# Patient Record
Sex: Male | Born: 1980 | Hispanic: No | Marital: Married | Smoking: Current every day smoker
Health system: Southern US, Community
[De-identification: ages and names within clinical notes are randomized; demographics above are authoritative.]

## PROBLEM LIST (undated history)

## (undated) DIAGNOSIS — F329 Major depressive disorder, single episode, unspecified: Secondary | ICD-10-CM

## (undated) DIAGNOSIS — F32A Depression, unspecified: Secondary | ICD-10-CM

## (undated) DIAGNOSIS — M549 Dorsalgia, unspecified: Secondary | ICD-10-CM

## (undated) DIAGNOSIS — G8929 Other chronic pain: Secondary | ICD-10-CM

## (undated) HISTORY — DX: Major depressive disorder, single episode, unspecified: F32.9

## (undated) HISTORY — DX: Depression, unspecified: F32.A

## (undated) HISTORY — PX: OPEN REDUCTION INTERNAL FIXATION (ORIF) HAND: SHX5991

---

## 1999-11-22 ENCOUNTER — Encounter: Payer: Self-pay | Admitting: Emergency Medicine

## 1999-11-22 ENCOUNTER — Observation Stay (HOSPITAL_COMMUNITY): Admission: EM | Admit: 1999-11-22 | Discharge: 1999-11-23 | Payer: Self-pay | Admitting: Emergency Medicine

## 2005-03-13 ENCOUNTER — Encounter: Admission: RE | Admit: 2005-03-13 | Discharge: 2005-05-08 | Payer: Self-pay | Admitting: Orthopedic Surgery

## 2007-06-15 ENCOUNTER — Emergency Department (HOSPITAL_COMMUNITY): Admission: EM | Admit: 2007-06-15 | Discharge: 2007-06-15 | Payer: Self-pay | Admitting: Emergency Medicine

## 2008-06-04 ENCOUNTER — Emergency Department (HOSPITAL_COMMUNITY): Admission: EM | Admit: 2008-06-04 | Discharge: 2008-06-04 | Payer: Self-pay | Admitting: Emergency Medicine

## 2008-09-04 ENCOUNTER — Emergency Department (HOSPITAL_COMMUNITY): Admission: EM | Admit: 2008-09-04 | Discharge: 2008-09-04 | Payer: Self-pay | Admitting: Emergency Medicine

## 2010-07-20 NOTE — Op Note (Signed)
[place]  Patient:    Omar Clark, Omar Clark                    MRN: [id number] Proc. Date: 11/22/99 Adm. Date:  [id number] Disc. Date: [id number] Attending:  Dominica Severin                           Operative Report  PREOPERATIVE DIAGNOSIS:  Right open fifth metacarpal fracture.  POSTOPERATIVE DIAGNOSIS:  Right open fifth metacarpal fracture.  PROCEDURES: 1. Incision and debridement of right open fifth metacarpal fracture. 2. Open reduction and internal fixation of right fifth metacarpal fracture.  SURGEON:  Elisha Ponder, M.D.  ASSISTANTS:  None.  COMPLICATIONS:  None.  ANESTHESIA:  General endotracheal.  DRAINS:  None.  ESTIMATED BLOOD LOSS:  Minimal.  INDICATIONS FOR THE PROCEDURE:  This patient is a 30 year old white male with the above-mentioned diagnosis.  The patient injured his hand at approximately 3 a.m.  I was called to see him at 6 a.m. on November 22, 1999, for the above-mentioned injuries.  He was consented for surgery.  All risks and benefits were discussed with him at length.  OPERATIVE FINDINGS:  This patient had a displaced fifth metacarpal fracture which was opened.  He underwent I&D and internal fixation with two Kirschner wires.  His bony stability was excellent after ORIF.  He tolerated the procedure well.  OPERATION IN DETAIL:  The patient was seen by myself and anesthesia.  He was then taken to the operating suite.  He was given a tetanus shot in the emergency room and IV antibiotics preoperatively.  Once in the operative suite, he underwent general endotracheal anesthesia.  He was then prepped and draped in the usual sterile fashion.  Following this, he underwent incision proximal and distal to a 3 cm laceration dorsal ulnarly where the bone was punctured through the skin.  Dissection was carried down through the skin with the knife blade.  The extensor tendons were preserved and retracted out of harms way.   An excisional debridement of the open area was performed. Dissection was carried down to the bone, which was noted to have periosteal stripping and was evident in the wound.  The bony ends were then identified and copiously lavaged and curettaged.  All bony areas were thoroughly washed. Once this was done and the I&D was done, the patient then underwent reduction of the fracture and placement of sutures and wires that entered at the metacarpal neck region and engaged proximally.  This was done while maintaining anatomic reduction.  Towels were changed prior to doing this after the I&D was complete.  The K-wires were checked under fluoroscopy.  Position was noted to be excellent.  I was happy with the position, stability, and anatomic alignment.  The fingers lined up well to the scaphoid tuberosity and there was no malalignment.  A this point in time, hemostasis was obtained. The patient then had closure of the wound with interrupted Prolene where the incision was made.  The open area was left opened to allow for egress of fluid.  The extensor tendons were intact.  There was periosteal stripping and this may increase the likelihood of extensor adhesions.  The patient tolerated the procedure well.  There were no immediate complications.  A soft compressive dressing with dorsal and volar plaster slabs was then applied. This was allowed to cure.  The drapes were removed.  He was transferred  to the recovery room after anesthesia performed extubation and other general postoperative measures.  He will be admitted for IV antibiotics, elevation, wound observation, and postoperative care.  I have discussed all findings with his fiancee. DD:  11/23/99 TD:  11/24/99 Job: [id number]

## 2012-04-03 ENCOUNTER — Encounter (HOSPITAL_COMMUNITY): Payer: Self-pay | Admitting: *Deleted

## 2012-04-03 ENCOUNTER — Emergency Department (INDEPENDENT_AMBULATORY_CARE_PROVIDER_SITE_OTHER): Payer: Self-pay

## 2012-04-03 ENCOUNTER — Emergency Department (INDEPENDENT_AMBULATORY_CARE_PROVIDER_SITE_OTHER)
Admission: EM | Admit: 2012-04-03 | Discharge: 2012-04-03 | Disposition: A | Discharge status: Home / Self Care | Payer: Self-pay | Source: Home / Self Care | Attending: Emergency Medicine | Admitting: Emergency Medicine

## 2012-04-03 DIAGNOSIS — J209 Acute bronchitis, unspecified: Secondary | ICD-10-CM

## 2012-04-03 DIAGNOSIS — R0789 Other chest pain: Secondary | ICD-10-CM

## 2012-04-03 HISTORY — DX: Other chronic pain: G89.29

## 2012-04-03 HISTORY — DX: Dorsalgia, unspecified: M54.9

## 2012-04-03 MED ORDER — GUAIFENESIN-CODEINE 100-10 MG/5ML PO SYRP: 10.0000 mL | ORAL_SOLUTION | Freq: Four times a day (QID) | ORAL | Status: DC | PRN

## 2012-04-03 MED ORDER — AMOXICILLIN 500 MG PO CAPS: 1000.0000 mg | ORAL_CAPSULE | Freq: Three times a day (TID) | ORAL | Status: DC

## 2012-04-03 MED ORDER — ALBUTEROL SULFATE HFA 108 (90 BASE) MCG/ACT IN AERS: 1.0000 | INHALATION_SPRAY | Freq: Four times a day (QID) | RESPIRATORY_TRACT | Status: DC | PRN

## 2012-04-03 NOTE — ED Notes (Signed)
States had fever, chills, body aches, HAs without cough or sore throat starting on Jan 10; approx 5-6 days later felt better, but started with cough then.  C/O worsening cough, left rib pain.  Has not been taking any meds.  Right lung clear.  Inspiratory wheezing on left with crackles in base.

## 2012-04-03 NOTE — ED Provider Notes (Signed)
Chief Complaint  Patient presents with  . Cough    History of Present Illness:   Omar Clark  is a 32 year old male who has had a three-week history beginning with a flulike illness on January 10. He had a fever and chills for about 5 days this was followed by a cough for about the past 2 weeks with yellow-green sputum production and some shortness of breath. He denies any wheezing. He has had pain in his left, anterolateral chest area which is worse to touch, with twisting and bending, and also with coughing, sneezing, deep inspiration. He's also had some nasal congestion and rhinorrhea with yellow-green, headache, ear congestion. He denies any sore throat or GI symptoms. He is a cigarette smoker and smokes about a pack of cigarettes per day. He denies any history of asthma. He's not been exposed to anything in particular. He has not gotten the flu vaccine this year.  Review of Systems:  Other than noted above, the patient denies any of the following symptoms. Systemic:  No fever, chills, sweats, fatigue, myalgias, headache, or anorexia. Eye:  No redness, pain or drainage. ENT:  No earache, ear congestion, nasal congestion, sneezing, rhinorrhea, sinus pressure, sinus pain, post nasal drip, or sore throat. Lungs:  No cough, sputum production, wheezing, shortness of breath, or chest pain. GI:  No abdominal pain, nausea, vomiting, or diarrhea.  PMFSH:  Past medical history, family history, social history, meds, and allergies were reviewed.  Physical Exam:   Vital signs:  BP 149/78  Pulse 80  Temp 98.1 F (36.7 C) (Oral)  Resp 20  SpO2 97% General:  Alert, in no distress. Eye:  No conjunctival injection or drainage. Lids were normal. ENT:  TMs and canals were normal, without erythema or inflammation.  Nasal mucosa was clear and uncongested, without drainage.  Mucous membranes were moist.  Pharynx was clear, without exudate or drainage.  There were no oral ulcerations or lesions. Neck:   Supple, no adenopathy, tenderness or mass. Lungs:  No respiratory distress.  He had wheezes and rales at the left base.  Heart:  Regular rhythm, without gallops, murmers or rubs. Skin:  Clear, warm, and dry, without rash or lesions.  Radiology:  Dg Chest 2 View  04/03/2012  *RADIOLOGY REPORT*  Clinical Data: Cough, fever  CHEST - 2 VIEW  Comparison: None.  Findings: Lungs are clear. No pleural effusion or pneumothorax.  Cardiomediastinal silhouette is within normal limits.  Visualized osseous structures are within normal limits.  IMPRESSION: No evidence of acute cardiopulmonary disease.   Original Report Authenticated By: Charline Bills, M.D.    I reviewed the images independently and personally and concur with the radiologist's findings.  Assessment:  The primary encounter diagnosis was Acute bronchitis. A diagnosis of Musculoskeletal chest pain was also pertinent to this visit.  Plan:   1.  The following meds were prescribed:   New Prescriptions   ALBUTEROL (PROVENTIL HFA;VENTOLIN HFA) 108 (90 BASE) MCG/ACT INHALER    Inhale 1-2 puffs into the lungs every 6 (six) hours as needed for wheezing.   AMOXICILLIN (AMOXIL) 500 MG CAPSULE    Take 2 capsules (1,000 mg total) by mouth 3 (three) times daily.   GUAIFENESIN-CODEINE (GUIATUSS AC) 100-10 MG/5ML SYRUP    Take 10 mLs by mouth 4 (four) times daily as needed for cough.   2.  The patient was instructed in symptomatic care and handouts were given. 3.  The patient was told to return if becoming worse in any  way, if no better in 3 or 4 days, and given some red flag symptoms that would indicate earlier return.   Reuben Likes, MD 04/03/12 [phone]

## 2012-04-03 NOTE — Discharge Instructions (Signed)
Most upper respiratory infections are caused by viruses and do not require antibiotics.  We try to save the antibiotics for when we really need them to avoid resistance.  This does not mean that there is nothing that can be done.  Here are a few hints about things that can be done at home to get over an upper respiratory infection quicker:  Get extra sleep and extra fluids.  Get 7 to 9 hours of sleep per night and 6 to 8 glasses of water a day.  Getting extra sleep keeps the immune system from getting run down.  Most people with an upper respiratory infection are a little dehydrated.  The extra fluids also keep the secretions liquified and easier to deal with.  Also, get extra vitamin C.  4000 mg per day is the recommended dose. For the aches, headache, and fever, acetaminophen or ibuprofen are helpful.  These can be alternated every 4 hours.  People with liver disease should avoid large amounts of acetaminophen, and people with ulcer disease, gastroesophageal reflux, gastritis, congestive heart failure, chronic kidney disease, coronary artery disease and the elderly should avoid ibuprofen. For nasal congestion try Mucinex-D, or if you're having lots of sneezing or copious clear nasal drainage Allegra-D-24 hour.  A Saline nasal spray such as Ocean Spray can also help as can decongestant sprays such as Afrin, but you should not use the decongestant sprays for more than 3 or 4 days since they can be habituating.  If nasal dryness is a problem, Ayr Nasal Gel can help moisturize your nasal passages.  Breath Rite nasal strips can also offer a non-drug alternative treatment to nasal congestion, especially at night. For people with symptoms of sinusitis, sleeping with your head elevated can be helpful.  For sinus pain, moist, hot compresses to the face may provide some relief.  Many people find that inhaling steam as in a shower or from a pot of steaming water can help. For sore throat, zinc containing lozenges such  as Cold-Eze or Zicam are helpful.  Zinc helps to fight infection and has a mild astringent effect that relieves the sore, achey throat.  Hot salt water gargles (8 oz of hot water, 1/2 tsp of table salt, and a pinch of baking soda) can give relief as well as hot beverages such as hot tea. For the cough, old time remedies such as honey or honey and lemon are tried and true.  Over the counter cough syrups such as Delsym 2 tsp every 12 hours can help as well.  It has also been found recently that Aleve can help control a cough.  The dose is 1 to 2 tablets twice daily with food.  This can be combined with Delsym. (Note, if you are taking ibuprofen, you should not take Aleve as well--take one or the other.)  It's important when you have an upper respiratory infection not to pass the infection to others.  This involves being very careful about the following:  Frequent hand washing or use of hand sanitizer, especially after coughing, sneezing, blowing your nose or touching your face, nose or eyes. Do not shake hands or touch anyone and try to avoid touching surfaces that other people use such as doorknobs, shopping carts, telephones and computer keyboards. Use tissues and dispose of them properly in a garbage can or ziplock bag. Cough into your sleeve. Do not let others eat or drink after you.  It's also important to recognize the signs of serious illness and  get evaluated if they occur: Any respiratory infection that lasts more than 7 to 10 days.  Yellow nasal drainage and sputum are not reliable indicators of a bacterial infection, but if they last for more than 1 week, see your doctor. Fever and sore throat can indicate strep. Fever and cough can indicate influenza or pneumonia. Any kind of severe symptom such as difficulty breathing, intractable vomiting, or severe pain should prompt you to see a doctor as soon as possible.   Your body's immune system is really the thing that will get rid of this  infection.  Your immune system is comprised of 2 types of specialized cells called T cells and B cells.  T cells coordinate the array of cells in your body that engulf invading bacteria or viruses while B cells orchestrate the production of antibodies that neutralize infection.  Anything we do or any medications we give you, will just strengthen your immune system or help it clear up the infection quicker.  Here are a few helpful hints to improve your immune system to help overcome this illness or to prevent future infections:  A few vitamins can improve the health of your immune system.  That's why your diet should include plenty of fruits, vegetables, fish, nuts, and whole grains.  Vitamin A and bet-carotene can increase the cells that fight infections (T cells and B cells).  Vitamin A is abundant in dark greens and orange vegetables such as spinach, greens, sweet potatoes, and carrots.  Vitamin B6 contributes to the maturation of white blood cells, the cells that fight disease.  Foods with vitamin B6 include cold cereal and bananas.  Vitamin C is credited with preventing colds because it increases white blood cells and also prevents cellular damage.  Citrus fruits, peaches and green and red bell peppers are all hight in vitamin C.  Vitamin E is an anti-oxidant that encourages the production of natural killer cells which reject foreign invaders and B cells that produce antibodies.  Foods high in vitamin E include wheat germ, nuts and seeds.  Foods high in omega-3 fatty acids found in foods like salmon, tuna and mackerel boost your immune system and help cells to engulf and absorb germs.  Probiotics are good bacteria that increase your T cells.  These can be found in yogurt and are available in supplements such as Culturelle or Align.  Moderate exercise increases the strength of your immune system and your ability to recover from illness.  I suggest 3 to 5 moderate intensity 30 minute workouts per  week.    Sleep is another component of maintaining a strong immune system.  It enables your body to recuperate from the day's activities, stress and work.  My recommendation is to get between 7 and 9 hours of sleep per night.  If you smoke, try to quit completely or at least cut down.  Drink alcohol only in moderation if at all.  No more than 2 drinks daily for men or 1 for women.  Get a flu vaccine early in the fall or if you have not gotten one yet, once this illness has run its course.  If you are over 65, a smoker, or an asthmatic, get a pneumococcal vaccine.  My final recommendation is to maintain a healthy weight.  Excess weight can impair the immune system by interfering with the way the immune system deals with invading viruses or bacteria.  Acute Bronchitis You have acute bronchitis. This means you have a chest cold.  The airways in your lungs are red and sore (inflamed). Acute means it is sudden onset.  CAUSES Bronchitis is most often caused by the same virus that causes a cold. SYMPTOMS   Body aches.  Chest congestion.  Chills.  Cough.  Fever.  Shortness of breath.  Sore throat. TREATMENT  Acute bronchitis is usually treated with rest, fluids, and medicines for relief of fever or cough. Most symptoms should go away after a few days or a week. Increased fluids may help thin your secretions and will prevent dehydration. Your caregiver may give you an inhaler to improve your symptoms. The inhaler reduces shortness of breath and helps control cough. You can take over-the-counter pain relievers or cough medicine to decrease coughing, pain, or fever. A cool-air vaporizer may help thin bronchial secretions and make it easier to clear your chest. Antibiotics are usually not needed but can be prescribed if you smoke, are seriously ill, have chronic lung problems, are elderly, or you are at higher risk for developing complications.Allergies and asthma can make bronchitis worse.  Repeated episodes of bronchitis may cause longstanding lung problems. Avoid smoking and secondhand smoke.Exposure to cigarette smoke or irritating chemicals will make bronchitis worse. If you are a cigarette smoker, consider using nicotine gum or skin patches to help control withdrawal symptoms. Quitting smoking will help your lungs heal faster. Recovery from bronchitis is often slow, but you should start feeling better after 2 to 3 days. Cough from bronchitis frequently lasts for 3 to 4 weeks. To prevent another bout of acute bronchitis:  Quit smoking.  Wash your hands frequently to get rid of viruses or use a hand sanitizer.  Avoid other people with cold or virus symptoms.  Try not to touch your hands to your mouth, nose, or eyes. SEEK IMMEDIATE MEDICAL CARE IF:  You develop increased fever, chills, or chest pain.  You have severe shortness of breath or bloody sputum.  You develop dehydration, fainting, repeated vomiting, or a severe headache.  You have no improvement after 1 week of treatment or you get worse. MAKE SURE YOU:   Understand these instructions.  Will watch your condition.  Will get help right away if you are not doing well or get worse. Document Released: 03/28/2004 Document Revised: 05/13/2011 Document Reviewed: 06/13/2010 [facility] Patient Information 2013 [place], [place]. Using Your Inhaler Proper inhaler technique is very important. Good technique assures that the medicine reaches the lungs. Poor technique results in depositing the medicine on the tongue and back of the throat rather than in the airways. STEPS TO FOLLOW IF USING INHALER WITHOUT EXTENSION TUBE: 1. Remove cap from inhaler. 2. Shake inhaler for 5 seconds before each inhalation (breathing in). 3. Position the inhaler so that the top of the canister faces up. 4. Put your index finger on the top of the medication canister. Your thumb supports the bottom of the inhaler. 5. Open your mouth. 6. Hold  the inhaler 1 to 2 inches away from your open mouth. This allows the medicine to slow down before the medicine enters the mouth. 7. Exhale (breathe out) normally and as completely as possible. 8. Press the canister down with the index finger to release the medication. 9. At the same time as the canister is pressed, inhale deeply and slowly until the lungs are completely filled. This should take 4 to 6 seconds. Keep your tongue down and out of the way. 10. Hold the medication in your lungs for up to 10 seconds (10 seconds is best).  This helps the medicine get into the small airways of your lungs to work better. 11. Breathe out slowly, through pursed lips. Whistling is an example of pursed lips. 12. Wait at least 1 minute between puffs. Continue with the above steps until you have taken the number of puffs your caregiver has ordered. 13. Replace cap on inhaler. STEPS TO FOLLOW USING AN INHALER WITH AN EXTENSION (SPACER): 1.  Remove cap from inhaler. 2. Shake inhaler for 5 seconds before each inhalation (breathing in). 3. Your caregiver has asked you to use a spacer with your inhaler. A spacer is a plastic tube with a mouthpiece on one end and an opening that connects to the inhaler on the other end. A spacer helps you take the medicine better. 4. Place the open end of the spacer onto the mouthpiece of the inhaler. 5. Position the inhaler so that the top of the canister faces up and the spacer mouthpiece faces you. 6. Put your index finger on the top of the medication canister. Your thumb supports the bottom of the inhaler and the spacer. 7. Exhale (breathe out) normally and as completely as possible. 8. Immediately after exhaling, place the spacer between your teeth and into your mouth. Close your mouth tightly around the spacer. 9. Press the canister down with the index finger to release the medication. 10. At the same time as the canister is pressed, inhale deeply and slowly until the lungs are  completely filled. This should take 4 to 6 seconds. Keep your tongue down and out of the way. 11. Hold the medication in your lungs for up to 10 seconds (10 seconds is best). This helps the medicine get into the small airways of your lungs to work better. Exhale. 12. Repeat inhaling deeply through the spacer mouthpiece. Again hold that breath for up to 10 seconds (10 seconds is best). Exhale slowly. If it is difficult to take this second deep breath through the spacer, breathe normally several times through the spacer. Remove the spacer from your mouth. 13. Wait at least 1 minute between puffs. Continue with the above steps until you have taken the number of puffs your caregiver has ordered. 14. Remove spacer from the inhaler and place cap on inhaler. If you are using different kinds of inhalers, use your quick relief medicine to open the airways 10 - 15 minutes before using a steroid. If you are unsure which inhalers to use and the order of using them, ask your caregiver, nurse, or respiratory therapist. If you are using a steroid inhaler, rinse your mouth with water after your last puff and then spit out the water. Do not swallow the water. Avoid the following:  Inhaling before or after starting the spray of medicine. It takes practice to coordinate your breathing with triggering the spray.  Inhaling through the nose (rather than the mouth) when triggering the spray. HOW TO DETERMINE IF YOUR INHALER IS FULL OR NEARLY EMPTY:  Determine when an inhaler is empty. You cannot know when an inhaler is empty by shaking it. A few inhalers are now being made with dose counters. Ask your caregiver for a prescription that has a dose counter if you feel you need that extra help.  If your inhaler does not have a counter, check the number of doses in the inhaler before you use it. The canister or box will list the number of doses in the canister. Divide the total number of doses in the canister by the number you  will use each day to find how many days the canister will last. (For example, if your canister has 200 doses and you take 2 puffs, 4 times each day, which is 8 puffs a day. Dividing 200 by 8 equals 25. The canister should last 25 days.) Using a calendar, count forward that many days to see when your inhaler will run out. Write the refill date on a calendar or your canister.  Remember, if you need to take extra doses, the inhaler will empty sooner than you figured. Be sure you have a refill before your canister runs out. Refill your inhaler 7 to 10 days before it runs out. HOME CARE INSTRUCTIONS   Do not use the inhaler more than your caregiver tells you. If you are still wheezing and are feeling tightness in your chest, call your caregiver.  Keep an adequate supply of medication. This includes making sure the medicine is not expired, and you have a spare inhaler.  Follow your caregiver or inhaler insert directions for cleaning the inhaler and spacer. SEEK MEDICAL CARE IF:   Symptoms are only partially relieved with your inhaler.  You are having trouble using your inhaler.  You experience some increase in phlegm.  You develop a fever of 100.5 F (38.1 C). SEEK IMMEDIATE MEDICAL CARE IF:   You feel little or no relief with your inhalers. You are still wheezing and are feeling shortness of breath and/or tightness in your chest.  You have side effects such as dizziness, headaches or fast heart rate.  You have chills, fever, night sweats or an oral temperature above 102 F (38.9 C).  Phlegm production increases a lot, or there is blood in the phlegm. MAKE SURE YOU:   Understand these instructions.  Will watch your condition.  Will get help right away if you are not doing well or get worse. Document Released: 02/16/2000 Document Revised: 05/13/2011 Document Reviewed: 12/06/2008 [facility] Patient Information 2013 [place], [place].

## 2012-11-11 ENCOUNTER — Other Ambulatory Visit: Payer: Self-pay | Admitting: Nurse Practitioner

## 2012-11-27 ENCOUNTER — Telehealth: Payer: Self-pay | Admitting: Radiology

## 2012-11-27 ENCOUNTER — Ambulatory Visit: Payer: Self-pay | Admitting: Internal Medicine

## 2012-11-27 VITALS — BP 140/90 | HR 79 | Temp 98.3°F | Resp 16 | Ht 70.0 in | Wt 177.6 lb

## 2012-11-27 DIAGNOSIS — M5116 Intervertebral disc disorders with radiculopathy, lumbar region: Secondary | ICD-10-CM

## 2012-11-27 DIAGNOSIS — M5126 Other intervertebral disc displacement, lumbar region: Secondary | ICD-10-CM

## 2012-11-27 DIAGNOSIS — M5136 Other intervertebral disc degeneration, lumbar region: Secondary | ICD-10-CM

## 2012-11-27 DIAGNOSIS — G894 Chronic pain syndrome: Secondary | ICD-10-CM

## 2012-11-27 DIAGNOSIS — M5416 Radiculopathy, lumbar region: Secondary | ICD-10-CM

## 2012-11-27 DIAGNOSIS — F1120 Opioid dependence, uncomplicated: Secondary | ICD-10-CM

## 2012-11-27 MED ORDER — OXYCODONE HCL ER 40 MG PO T12A: EXTENDED_RELEASE_TABLET | ORAL | Status: DC

## 2012-11-27 MED ORDER — OXYCODONE HCL 30 MG PO TABS: 30.0000 mg | ORAL_TABLET | ORAL | Status: DC | PRN

## 2012-11-27 MED ORDER — GABAPENTIN 300 MG PO CAPS: 300.0000 mg | ORAL_CAPSULE | Freq: Every day | ORAL | Status: DC

## 2012-11-27 NOTE — Telephone Encounter (Signed)
Called patient to advise Rx for Gabapentin was sent in for him.  Left message for him, advised if questions he should call me back.

## 2012-11-27 NOTE — Progress Notes (Signed)
  Subjective:    Patient ID: Omar Clark, male    DOB: 14-Oct-1980, 32 y.o.   MRN: [id number]  HPI Back pain x 7 years. Currently in pain management at [facility]. States he has had extensive work up in the past by [employer] - diagnosed with disk disease in back pinching nerves. Denies any acute injury. Did play professional volleyball for years.  Is frustrated with pain managements - all they do is write his prescriptions.  Taking oxycodone 30 mg every 4 hours (bottle says 6) and robaxin 750 mg at bedtime.  Wants to get out of pain clinic and off pain medication.  Pain 6/10 while taking medication.  Lumbar spine. Has now developed shooting pain in legs that has worsened. Trips over right foot - dx as foot drop.  Bilateral paresthesias.  Has Facet's disease intermittent pains.    Ref by Revonda Standard No othe rilln Child/married   Review of Systems  Constitutional: Negative for unexpected weight change.  Musculoskeletal: Positive for back pain (lumbar) and gait problem.  Neurological: Positive for weakness and numbness (bilateral legs).  Psychiatric/Behavioral: Negative for behavioral problems.       Objective:   Physical Exam BP 140/90  Pulse 79  Temp(Src) 98.3 F (36.8 C) (Oral)  Resp 16  Ht 5\' 10"  (1.778 m)  Wt 177 lb 9.6 oz (80.559 kg)  BMI 25.48 kg/m2  SpO2 98% No acute distress Mood stable/affect appropriate/thought content normal/no flight of ideas or grandiosity Appearance normal Heart regular No respiratory distress Cranial nerves 2-12 intact Gait normal Straight leg raise to 90 stable bilaterally without radicular symptoms Muted deep tendon reflexes but symmetrical No sensory losses Shin push and toe up appear weak    Assessment & Plan:  Lumbar radicular syndrome - Plan: MR Lumbar Spine Wo Contrast  Lumbar disc disease with radiculopathy - Plan: MR Lumbar Spine Wo Contrast  Opiate addiction  Lumbar degenerative disc disease  Chronic pain syndrome  Meds  ordered this encounter  Medications  . oxycodone (ROXICODONE) 30 MG immediate release tablet    Sig: Take 1 tablet (30 mg total) by mouth every 4 (four) hours as needed for pain. Up to 5 times a day    Dispense:  150 tablet    Refill:  0  . OxyCODONE (OXYCONTIN) 40 mg T12A 12 hr tablet    Sig: At bedtime    Dispense:  30 tablet    Refill:  0  . gabapentin (NEURONTIN) 300 MG capsule    Sig: Take 1 capsule (300 mg total) by mouth at bedtime.    Dispense:  30 capsule    Refill:  0   Will continue same medications, but changed to a 12 hour OxyContin at bedtime and add Neurontin at bedtime trying to ensure sleep Will set MRI and subsequent orthopedic evaluation to begin to reestablish full function and withdraw from pain meds

## 2012-11-29 DIAGNOSIS — G894 Chronic pain syndrome: Secondary | ICD-10-CM | POA: Insufficient documentation

## 2012-11-29 DIAGNOSIS — M5136 Other intervertebral disc degeneration, lumbar region: Secondary | ICD-10-CM | POA: Insufficient documentation

## 2012-11-29 DIAGNOSIS — F1120 Opioid dependence, uncomplicated: Secondary | ICD-10-CM | POA: Insufficient documentation

## 2012-12-09 ENCOUNTER — Telehealth: Payer: Self-pay | Admitting: Radiology

## 2012-12-09 ENCOUNTER — Ambulatory Visit
Admission: RE | Admit: 2012-12-09 | Discharge: 2012-12-09 | Disposition: A | Discharge status: Home / Self Care | Payer: Self-pay | Source: Ambulatory Visit | Attending: Internal Medicine | Admitting: Internal Medicine

## 2012-12-09 DIAGNOSIS — M5136 Other intervertebral disc degeneration, lumbar region: Secondary | ICD-10-CM

## 2012-12-09 DIAGNOSIS — M5416 Radiculopathy, lumbar region: Secondary | ICD-10-CM

## 2012-12-09 DIAGNOSIS — M5116 Intervertebral disc disorders with radiculopathy, lumbar region: Secondary | ICD-10-CM

## 2012-12-09 NOTE — Telephone Encounter (Signed)
Patient does not meet criteria for MRI scan with Medicaid at this time, MRI lumbar has been denied, FYI to you. Please advise

## 2012-12-10 NOTE — Telephone Encounter (Signed)
Let him know--I will refer to Dr Yevette Edwards and hopefully he will have more power with insurance

## 2012-12-10 NOTE — Telephone Encounter (Signed)
Left message for him to call me back so I can advise.  

## 2012-12-21 ENCOUNTER — Ambulatory Visit: Payer: Self-pay | Admitting: Internal Medicine

## 2012-12-21 VITALS — BP 126/82 | HR 100 | Temp 97.9°F | Resp 18 | Ht 70.0 in | Wt 181.0 lb

## 2012-12-21 DIAGNOSIS — M5136 Other intervertebral disc degeneration, lumbar region: Secondary | ICD-10-CM

## 2012-12-21 DIAGNOSIS — J9801 Acute bronchospasm: Secondary | ICD-10-CM

## 2012-12-21 DIAGNOSIS — G894 Chronic pain syndrome: Secondary | ICD-10-CM

## 2012-12-21 DIAGNOSIS — J019 Acute sinusitis, unspecified: Secondary | ICD-10-CM

## 2012-12-21 MED ORDER — PREDNISONE 20 MG PO TABS: 20.0000 mg | ORAL_TABLET | Freq: Every day | ORAL | Status: DC

## 2012-12-21 MED ORDER — OXYCODONE HCL ER 40 MG PO T12A: EXTENDED_RELEASE_TABLET | ORAL | Status: DC

## 2012-12-21 MED ORDER — AMOXICILLIN 500 MG PO CAPS: 1000.0000 mg | ORAL_CAPSULE | Freq: Two times a day (BID) | ORAL | Status: AC

## 2012-12-21 MED ORDER — OXYCODONE HCL 30 MG PO TABS: 30.0000 mg | ORAL_TABLET | ORAL | Status: DC | PRN

## 2012-12-21 MED ORDER — GABAPENTIN 300 MG PO CAPS: 300.0000 mg | ORAL_CAPSULE | Freq: Every day | ORAL | Status: DC

## 2012-12-21 MED ORDER — ALBUTEROL SULFATE HFA 108 (90 BASE) MCG/ACT IN AERS: 1.0000 | INHALATION_SPRAY | Freq: Four times a day (QID) | RESPIRATORY_TRACT | Status: DC | PRN

## 2012-12-21 NOTE — Progress Notes (Signed)
  Subjective:    Patient ID: Omar Clark, male    DOB: 08/13/1980, 32 y.o.   MRN: [id number]  HPI 32 year old male complains of chest/head congestion x1 week, started last Monday, October 13th. Pt states he has decreased the amount he is smoking. Pt has nasal discharge specifically in the morning.  Chest feels tight. Cough is productive in the morning only.  Pt states Neurontin and Oxycontin are helping with pain, especially in the evening.  For his chronic pain syndrome he has responded to our medication change and is now sleeping and feels much better. His insurance refused to cover an MRI. I still believe his problems can be treated without relegating him to chronic pain syndrome status and we'll try to establish orthopedic evaluation. Perhaps they can obtain an MRI. He is definitely underinsured asking him to obtain insurance through the affordable care act if possible  Review of Systems Negative    Objective:   Physical Exam BP 126/82  Pulse 100  Temp(Src) 97.9 F (36.6 C) (Oral)  Resp 18  Ht 5\' 10"  (1.778 m)  Wt 181 lb (82.101 kg)  BMI 25.97 kg/m2  SpO2 98% TMs clear Nares boggy with purulence Tender ms Throat clear No nodes Chest with wheezing bilaterally on forced expiration/rhonchi generalized      Assessment & Plan:  Sinusitis with bronchospasm/reactive airway disease in a smoker  Continue to decrease smoking/amoxicillin/prednisone/albuterol  Chronic spine problems  Refill medicines/continue to pursue orthopedic care

## 2012-12-22 ENCOUNTER — Telehealth: Payer: Self-pay

## 2012-12-22 NOTE — Telephone Encounter (Signed)
Pt did not meet the criteria for [place] approval of Oxycontin 40 mg w/out trial of preferred meds: Fentanyl Patches, Morphine sulfate ER of Opana ER. I have LMOM to advise pt. Pt has been paying OOP for the Rx the last two refills. Does he want Dr Merla Riches to try to Rx him one of the preferred, or continue on Oxycontin at his expense? Faxed denial to pharmacy.

## 2012-12-22 NOTE — Telephone Encounter (Signed)
Pt CB and I advised him of denial. Pt stated that he found a pharmacy that is a little less expensive and he will pay OOP for now, but will call back if he decides that he wants to try one of the preferred medications.

## 2013-01-18 ENCOUNTER — Ambulatory Visit: Payer: Self-pay | Admitting: Internal Medicine

## 2013-01-18 VITALS — BP 118/70 | HR 98 | Temp 98.2°F | Resp 18 | Ht 70.0 in | Wt 175.4 lb

## 2013-01-18 DIAGNOSIS — R109 Unspecified abdominal pain: Secondary | ICD-10-CM

## 2013-01-18 DIAGNOSIS — G8929 Other chronic pain: Secondary | ICD-10-CM

## 2013-01-18 DIAGNOSIS — J309 Allergic rhinitis, unspecified: Secondary | ICD-10-CM

## 2013-01-18 DIAGNOSIS — J9801 Acute bronchospasm: Secondary | ICD-10-CM

## 2013-01-18 DIAGNOSIS — M549 Dorsalgia, unspecified: Secondary | ICD-10-CM

## 2013-01-18 DIAGNOSIS — J22 Unspecified acute lower respiratory infection: Secondary | ICD-10-CM

## 2013-01-18 DIAGNOSIS — F17200 Nicotine dependence, unspecified, uncomplicated: Secondary | ICD-10-CM

## 2013-01-18 DIAGNOSIS — F1120 Opioid dependence, uncomplicated: Secondary | ICD-10-CM

## 2013-01-18 MED ORDER — CETIRIZINE HCL 10 MG PO TABS: 10.0000 mg | ORAL_TABLET | Freq: Every day | ORAL | Status: DC

## 2013-01-18 MED ORDER — PREDNISONE 20 MG PO TABS: 20.0000 mg | ORAL_TABLET | Freq: Every day | ORAL | Status: DC

## 2013-01-18 MED ORDER — GABAPENTIN 300 MG PO CAPS: 300.0000 mg | ORAL_CAPSULE | Freq: Every day | ORAL | Status: DC

## 2013-01-18 MED ORDER — OXYCODONE HCL 30 MG PO TABS: 30.0000 mg | ORAL_TABLET | ORAL | Status: DC | PRN

## 2013-01-18 MED ORDER — MORPHINE SULFATE ER 30 MG PO TBCR: EXTENDED_RELEASE_TABLET | ORAL | Status: DC

## 2013-01-18 MED ORDER — AZITHROMYCIN 250 MG PO TABS: ORAL_TABLET | ORAL | Status: DC

## 2013-01-18 NOTE — Progress Notes (Signed)
  Subjective:      Patient ID: Omar Clark, male    DOB: 1980-11-07, 32 y.o.   MRN: [id number]  HPI    Review of Systems     Objective:   Physical Exam        Assessment & Plan:

## 2013-01-18 NOTE — Progress Notes (Addendum)
Subjective:    Patient ID: Omar Clark, male    DOB: [birth year], 32 y.o.   MRN: [id number] This chart was scribed for [name], MD by Clydene Laming, ED Scribe. This patient was seen in room 10  and the patient's care was started at 4:32 PM. HPI HPI Comments: Omar Clark is a 32 y.o. male who presents to the [facility] complaining of sob and is also requesting a medication refill. Pt states he feels like something in his left lung when he lays down and also feels symptoms when he is active. He becomes winded when he starts to do things. At some points he feels like something is in the bottom of the lung which he clears by coughing. He denies any decrease in appetite, diaphoresis, or fever. He reports being a bit tachycardic upon arrival today. Pt states he is now down to one cigarette per day due to trouble breathing. He is also sneezing a bit. He reports sleeping better since last changes.   Has not heard from ortho referral Insurance denied [place] the ability to order MRI   Patient Active Problem List   Diagnosis Date Noted  . Opiate addiction 11/29/2012  . Lumbar degenerative disc disease 11/29/2012  . Chronic pain syndrome 11/29/2012   Past Medical History  Diagnosis Date  . Chronic back pain    Past Surgical History  Procedure Laterality Date  . Open reduction internal fixation (orif) hand     Allergies  Allergen Reactions  . Aspirin Hives   Prior to Admission medications   Medication Sig Start Date End Date Taking? Authorizing Provider  albuterol (PROVENTIL HFA;VENTOLIN HFA) 108 (90 BASE) MCG/ACT inhaler Inhale 1-2 puffs into the lungs every 6 (six) hours as needed for wheezing. 12/21/12  Yes [name], MD  gabapentin (NEURONTIN) 300 MG capsule Take 1 capsule (300 mg total) by mouth at bedtime. 12/21/12  Yes [name], MD  OxyCODONE (OXYCONTIN) 40 mg T12A 12 hr tablet At bedtime 12/21/12  Yes [name], MD   oxycodone (ROXICODONE) 30 MG immediate release tablet Take 1 tablet (30 mg total) by mouth every 4 (four) hours as needed for pain. Up to 5 times a day 12/21/12  Yes [name], MD   History   Social History  . Marital Status: Married    Spouse Name: N/A    Number of Children: N/A  . Years of Education: N/A   Occupational History  . Not on file.   Social History Main Topics  . Smoking status: Current Every Day Smoker -- 1.00 packs/day    Types: Cigarettes  . Smokeless tobacco: Not on file  . Alcohol Use: No  . Drug Use: No  . Sexual Activity:    Other Topics Concern  . Not on file   Social History Narrative  . No narrative on file     Review of Systems  Constitutional: Negative for fever and diaphoresis.  Respiratory: Positive for cough and shortness of breath.   Psychiatric/Behavioral: Negative for sleep disturbance.       Objective:   Physical Exam  Vitals reviewed. Constitutional: He is oriented to person, place, and time. He appears well-developed and well-nourished.  HENT:  Head: Normocephalic and atraumatic.  Right Ear: Tympanic membrane, external ear and ear canal normal.  Left Ear: Tympanic membrane, external ear and ear canal normal.  Nose: No rhinorrhea.  Mouth/Throat: Oropharynx is clear and moist and mucous membranes are normal.  No oropharyngeal exudate or posterior oropharyngeal erythema.  Nares boggy w/ aller signs  Eyes: Conjunctivae are normal. Pupils are equal, round, and reactive to light.  Neck: Neck supple.  Cardiovascular: Normal rate, regular rhythm, normal heart sounds and intact distal pulses.   No murmur heard. Pulmonary/Chest: Effort normal. He has wheezes. He has no rhonchi. He has no rales.  Forced expir bilat--mild  Abdominal: Soft. There is no tenderness.  Lymphadenopathy:    He has no cervical adenopathy.  Neurological: He is alert and oriented to person, place, and time.  Skin: Skin is warm and dry. No rash noted.   Psychiatric: He has a normal mood and affect. His behavior is normal.    Filed Vitals:   01/18/13 1534  BP: 118/70  Pulse: 98  Temp: 98.2 F (36.8 C)  TempSrc: Oral  Resp: 18  Height: 5\' 10"  (1.778 m)  Weight: 175 lb 6.4 oz (79.561 kg)  SpO2: 99%        Assessment & Plan:  4:40 PM- Discussed treatment plan with pt at bedside. Pt verbalized understanding and agreement with plan.  Acute bronchospasm due to Lower respiratory infection /smoking---now at one cig a day  80% better//retreat zith/pred  Back pain, chronic - Plan: at insurance request we will change bedtime dose of oxy 12hr to  morphine (MS CONTIN) 30 MG 12 hr tablet, and continue daytime oxycodone (ROXICODONE) 30 MG immediate release tablet,   gabapentin (NEURONTIN) 300 MG capsule continued at hs as well  Allergic rhinitis - Plan: cetirizine (ZYRTEC) 10 MG tablet started  Opiate addiction due to chr pain//arranging ortho eval--he should be able to respond to treatment and have a life off opiates  Meds ordered this encounter  Medications  . morphine (MS CONTIN) 30 MG 12 hr tablet    Sig: One at bedtime    Dispense:  30 tablet    Refill:  0  . oxycodone (ROXICODONE) 30 MG immediate release tablet    Sig: Take 1 tablet (30 mg total) by mouth every 4 (four) hours as needed for pain. Up to 5 times a day    Dispense:  150 tablet    Refill:  0  . predniSONE (DELTASONE) 20 MG tablet    Sig: Take 1 tablet (20 mg total) by mouth daily. 3/3/2/2/1/1 single daily dose for 6 days    Dispense:  12 tablet    Refill:  0  . cetirizine (ZYRTEC) 10 MG tablet    Sig: Take 1 tablet (10 mg total) by mouth daily.    Dispense:  30 tablet    Refill:  11  . gabapentin (NEURONTIN) 300 MG capsule    Sig: Take 1 capsule (300 mg total) by mouth at bedtime.    Dispense:  30 capsule    Refill:  0  . cetirizine (ZYRTEC) 10 MG tablet    Sig: Take 1 tablet (10 mg total) by mouth daily.    Dispense:  30 tablet    Refill:  11  .  azithromycin (ZITHROMAX) 250 MG tablet    Sig: As packaged    Dispense:  6 tablet    Refill:  0      I have completed the patient encounter in its entirety as documented by the scribe, with editing by me where necessary. Robert P. Merla Riches, M.D.

## 2013-01-25 ENCOUNTER — Telehealth: Payer: Self-pay

## 2013-01-25 MED ORDER — OXYCODONE HCL ER 40 MG PO T12A: EXTENDED_RELEASE_TABLET | ORAL | Status: DC

## 2013-01-25 NOTE — Telephone Encounter (Signed)
Called no answer

## 2013-01-25 NOTE — Telephone Encounter (Signed)
Ok Meds ordered this encounter  Medications  . OxyCODONE (OXYCONTIN) 40 mg T12A 12 hr tablet    Sig: At bedtime    Dispense:  30 tablet    Refill:  0

## 2013-01-25 NOTE — Telephone Encounter (Signed)
DOOLITTLE - PT SAYS THE NEW MEDICINE IS NOT WORKING AND HE WOULD RATHER GO BACK TO THE OXYCODONE.  HE SAYS IT DOESN'T MATTER THAT HIS INSURANCE DOESN'T COVER IT, THAT HE WILL PAY FOR IT.  [phone]

## 2013-01-25 NOTE — Telephone Encounter (Signed)
Before we change, have him try 2 at bedtime and if not successful, we can change

## 2013-01-25 NOTE — Telephone Encounter (Signed)
Called him, he states he does not want to do this, he c/o nausea with the medication and indicates he wants to change back to the Oxycodone

## 2013-01-26 NOTE — Telephone Encounter (Signed)
lmom that rx is ready for pickup.  

## 2013-02-08 ENCOUNTER — Ambulatory Visit: Payer: Self-pay | Admitting: Internal Medicine

## 2013-02-08 VITALS — BP 104/60 | HR 81 | Temp 98.2°F | Resp 16 | Ht 70.0 in | Wt 187.0 lb

## 2013-02-08 DIAGNOSIS — J9801 Acute bronchospasm: Secondary | ICD-10-CM

## 2013-02-08 DIAGNOSIS — G8929 Other chronic pain: Secondary | ICD-10-CM

## 2013-02-08 MED ORDER — ALBUTEROL SULFATE HFA 108 (90 BASE) MCG/ACT IN AERS: 1.0000 | INHALATION_SPRAY | Freq: Four times a day (QID) | RESPIRATORY_TRACT | Status: DC | PRN

## 2013-02-08 MED ORDER — GABAPENTIN 300 MG PO CAPS: 300.0000 mg | ORAL_CAPSULE | Freq: Every day | ORAL | Status: DC

## 2013-02-08 MED ORDER — OXYCODONE HCL ER 40 MG PO T12A: EXTENDED_RELEASE_TABLET | ORAL | Status: DC

## 2013-02-08 MED ORDER — OXYCODONE HCL 30 MG PO TABS: 30.0000 mg | ORAL_TABLET | ORAL | Status: DC | PRN

## 2013-02-08 MED ORDER — VARENICLINE TARTRATE 0.5 MG X 11 & 1 MG X 42 PO MISC: ORAL | Status: DC

## 2013-02-08 NOTE — Progress Notes (Signed)
Subjective:    Patient ID: Omar Clark, male    DOB: [birth year], 32 y.o.   MRN: [id number] This chart was scribed for [name], MD by Clydene Laming, ED Scribe. This patient was seen in room 11 and the patient's care was started at 6:22 PM. HPI HPI Comments: Omar Clark is a 32 y.o. male who presents to the [facility] complaining of recent probs w/ shortness of breath. Pt also needs a medication refill. He states everything is going smoothly at home and he is not stressed. He states his wife no longer hears noises in his lungs when she lays on his chest. Still awaiting eval for chronic pain which has not yet had a sufficient orthopedic eval and rx. Smoking less.Wants to try chantix which let hs wife quit. [place]   Patient Active Problem List   Diagnosis Date Noted  . Opiate addiction 11/29/2012  . Lumbar degenerative disc disease 11/29/2012  . Chronic pain syndrome 11/29/2012   Past Medical History  Diagnosis Date  . Chronic back pain    Past Surgical History  Procedure Laterality Date  . Open reduction internal fixation (orif) hand     Allergies  Allergen Reactions  . Aspirin Hives   Prior to Admission medications   Medication Sig Start Date End Date Taking? Authorizing Provider  albuterol (PROVENTIL HFA;VENTOLIN HFA) 108 (90 BASE) MCG/ACT inhaler Inhale 1-2 puffs into the lungs every 6 (six) hours as needed for wheezing. 02/08/13  Yes [name], MD  cetirizine (ZYRTEC) 10 MG tablet Take 1 tablet (10 mg total) by mouth daily. 01/18/13  Yes [name], MD  gabapentin (NEURONTIN) 300 MG capsule Take 1 capsule (300 mg total) by mouth at bedtime. 02/08/13  Yes [name], MD  OxyCODONE (OXYCONTIN) 40 mg T12A 12 hr tablet At bedtime. Needs this on 02/16/13. Will be out of town 12/10-03/04/13. Can he fill early or get it at your store in [place]? 02/08/13  Yes [name], MD    oxycodone (ROXICODONE) 30 MG immediate release tablet Take 1 tablet (30 mg total) by mouth every 4 (four) hours as needed for pain. Up to 5 times a day.Needs this on 02/16/13. Will be out of town 12/10-03/04/13. Can he fill early or get it at your store in [place]? 02/08/13  Yes [name], MD       Review of Systems  Constitutional: Negative for fever, fatigue and unexpected weight change.  Genitourinary: Negative for difficulty urinating.  Psychiatric/Behavioral: Negative for behavioral problems, sleep disturbance and dysphoric mood.       Objective:   Physical Exam  Nursing note and vitals reviewed. Constitutional: He is oriented to person, place, and time. He appears well-developed and well-nourished. No distress.  HENT:  Head: Normocephalic and atraumatic.  Eyes: EOM are normal. Pupils are equal, round, and reactive to light.  Neck: Normal range of motion.  Cardiovascular: Normal rate and regular rhythm.   No murmur heard. Pulmonary/Chest: Effort normal and breath sounds normal. No respiratory distress. He has no wheezes.  Lungs are clear  Neurological: He is alert and oriented to person, place, and time.  Skin: Skin is warm and dry.  Psychiatric: He has a normal mood and affect. His behavior is normal. Judgment and thought content normal.   Filed Vitals:   02/08/13 1728  BP: 104/60  Pulse: 81  Temp: 98.2 F (36.8 C)  TempSrc: Oral  Resp:  16  Height: 5\' 10"  (1.778 m)  Weight: 187 lb (84.823 kg)  SpO2: 98%         Assessment & Plan:  I have completed the patient encounter in its entirety as documented by the scribe, with editing by me where necessary. Abrielle Finck P. Merla Riches, M.D. Back pain, chronic - Plan: gabapentin (NEURONTIN) 300 MG capsule, OxyCODONE (OXYCONTIN) 40 mg T12A 12 hr tablet, oxycodone (ROXICODONE) 30 MG immediate release tablet  Meds ordered this encounter  Medications  . albuterol (PROVENTIL HFA;VENTOLIN HFA) 108 (90 BASE) MCG/ACT inhaler     Sig: Inhale 1-2 puffs into the lungs every 6 (six) hours as needed for wheezing.    Dispense:  1 Inhaler    Refill:  3  . gabapentin (NEURONTIN) 300 MG capsule    Sig: Take 1 capsule (300 mg total) by mouth at bedtime.    Dispense:  30 capsule    Refill:  5  . OxyCODONE (OXYCONTIN) 40 mg T12A 12 hr tablet    Sig: At bedtime. Needs this on 02/16/13. Will be out of town 12/10-03/04/13. Can he fill early or get it at your store in [place]?    Dispense:  30 tablet    Refill:  0  . oxycodone (ROXICODONE) 30 MG immediate release tablet    Sig: Take 1 tablet (30 mg total) by mouth every 4 (four) hours as needed for pain. Up to 5 times a day.Needs this on 02/16/13. Will be out of town 12/10-03/04/13. Can he fill early or get it at your store in [place]?    Dispense:  150 tablet    Refill:  0  . varenicline (CHANTIX STARTING MONTH PAK) 0.5 MG X 11 & 1 MG X 42 tablet    Sig: Take one 0.5 mg tablet by mouth once daily for 3 days, then increase to one 0.5 mg tablet twice daily for 4 days, then increase to one 1 mg tablet twice daily.    Dispense:  53 tablet    Refill:  0   F/u 1/7

## 2013-02-10 NOTE — Progress Notes (Signed)
Appointment set for 03/10/13 @ 9:15am.

## 2013-03-10 ENCOUNTER — Other Ambulatory Visit: Payer: Self-pay

## 2013-03-10 ENCOUNTER — Encounter: Payer: Self-pay | Admitting: Internal Medicine

## 2013-03-10 ENCOUNTER — Ambulatory Visit: Payer: Self-pay | Admitting: Internal Medicine

## 2013-03-10 ENCOUNTER — Telehealth: Payer: Self-pay | Admitting: Radiology

## 2013-03-10 VITALS — BP 106/70 | HR 68 | Temp 98.2°F | Resp 16 | Ht 70.0 in | Wt 183.2 lb

## 2013-03-10 DIAGNOSIS — R079 Chest pain, unspecified: Secondary | ICD-10-CM

## 2013-03-10 DIAGNOSIS — G894 Chronic pain syndrome: Secondary | ICD-10-CM

## 2013-03-10 DIAGNOSIS — F17200 Nicotine dependence, unspecified, uncomplicated: Secondary | ICD-10-CM

## 2013-03-10 DIAGNOSIS — M549 Dorsalgia, unspecified: Secondary | ICD-10-CM

## 2013-03-10 DIAGNOSIS — J45909 Unspecified asthma, uncomplicated: Secondary | ICD-10-CM | POA: Insufficient documentation

## 2013-03-10 DIAGNOSIS — R05 Cough: Secondary | ICD-10-CM

## 2013-03-10 DIAGNOSIS — R059 Cough, unspecified: Secondary | ICD-10-CM

## 2013-03-10 DIAGNOSIS — M5136 Other intervertebral disc degeneration, lumbar region: Secondary | ICD-10-CM

## 2013-03-10 DIAGNOSIS — G8929 Other chronic pain: Secondary | ICD-10-CM

## 2013-03-10 MED ORDER — OXYCODONE HCL ER 40 MG PO T12A: EXTENDED_RELEASE_TABLET | ORAL | Status: DC

## 2013-03-10 MED ORDER — OXYCODONE HCL 30 MG PO TABS: 30.0000 mg | ORAL_TABLET | ORAL | Status: DC | PRN

## 2013-03-10 NOTE — Telephone Encounter (Signed)
Pharmacy called to verify when Rx is to be filled advised on 03/18/13

## 2013-03-10 NOTE — Progress Notes (Signed)
   Subjective:    Patient ID: Omar Clark, male    DOB: 1980-05-20, 33 y.o.   MRN: [id number]  HPIhere for f/u Patient Active Problem List   Diagnosis Date Noted  . Chronic pain syndrome 11/29/2012    Priority: Medium  . Smoker 03/10/2013  . Opiate addiction 11/29/2012  . Lumbar degenerative disc disease 11/29/2012    -  Reactive Airway Disease  pulm sxt much impr/still smoking but has rx chantix to start soon Kids 9,6,206mos so needs to quit!! Pain controlled-awaiting ortho eval-ref to Dr Yevette Edwardsumonski Continues with L ant chest pain w/ deep br and lifting-now 3 months-no GI sxt/no fever, NS, Wt Loss--chr cough-nonprod  Remember initial presentation oct 2014= Back pain x 7 years. Currently in pain management at [facility]. States he has had extensive work up in the past by [employer] - diagnosed with disk disease in back pinching nerves. Denies any acute injury. Did play professional volleyball for years. Is frustrated with pain managements - all they do is write his prescriptions.  Taking oxycodone 30 mg every 4 hours (bottle says 6) and robaxin 750 mg at bedtime.  Wants to get out of pain clinic and off pain medication.  Pain 6/10 while taking medication. Lumbar spine. Has now developed shooting pain in legs that has worsened. Trips over right foot - dx as foot drop. Bilateral paresthesias.  Referrals and MRI thwarted by no insurance and then by refusal of medicaid to pay for mri ordered by me  He is much better now his pain is been stabilized and doesn't have definite neurological deficit in the right foot///  Review of Systems No other sxt    Objective:   Physical Exam BP 106/70  Pulse 68  Temp(Src) 98.2 F (36.8 C) (Oral)  Resp 16  Ht 5\' 10"  (1.778 m)  Wt 183 lb 3.2 oz (83.099 kg)  BMI 26.29 kg/m2  SpO2 97% HEENT cl No nodes Chest now clear tho tender to palp around L lower ant ribs w/ pain on twist and deep br Heart regular without murmur    Assessment & Plan:  Back  pain, chronic - Plan: OxyCODONE (OXYCONTIN) 40 mg T12A 12 hr tablet, oxycodone (ROXICODONE) 30 MG immediate release tablet, Ambulatory referral to Orthopedic Surgery  Chest pain, unspecified - Plan: DG Chest 2 View  Cough - Plan: DG Chest 2 View  Smoker - Plan: DG Chest 2 View///Chantix with Commit to quit  RAD (reactive airway disease)-stable now with when necessary inhaler rarely needed  Chronic pain syndrome  Lumbar degenerative disc disease  Referral to Dr. Yevette Edwardsumonski--- I still believe that his workup was not adequate and had the opportunity to offer him a cure he is through surgery or physical therapy was not pursued well enough//he needs to reevaluation with plan that would get him off chronic pain meds  Meds ordered this encounter  Medications  . OxyCODONE (OXYCONTIN) 40 mg T12A 12 hr tablet    Sig: At bedtime. For 03/18/13    Dispense:  30 tablet    Refill:  0  . oxycodone (ROXICODONE) 30 MG immediate release tablet    Sig: Take 1 tablet (30 mg total) by mouth every 4 (four) hours as needed for pain. Up to 5 times a day.for 1/15/1/5.    Dispense:  150 tablet    Refill:  0   F/u 2/11

## 2013-04-13 ENCOUNTER — Telehealth: Payer: Self-pay

## 2013-04-14 ENCOUNTER — Encounter: Payer: Self-pay | Admitting: Internal Medicine

## 2013-04-14 ENCOUNTER — Ambulatory Visit: Payer: Self-pay | Admitting: Internal Medicine

## 2013-04-14 VITALS — BP 130/76 | HR 94 | Temp 98.1°F | Resp 18 | Ht 70.0 in | Wt 181.0 lb

## 2013-04-14 DIAGNOSIS — G8929 Other chronic pain: Secondary | ICD-10-CM

## 2013-04-14 DIAGNOSIS — M5136 Other intervertebral disc degeneration, lumbar region: Secondary | ICD-10-CM

## 2013-04-14 DIAGNOSIS — R21 Rash and other nonspecific skin eruption: Secondary | ICD-10-CM

## 2013-04-14 DIAGNOSIS — M549 Dorsalgia, unspecified: Secondary | ICD-10-CM

## 2013-04-14 DIAGNOSIS — L508 Other urticaria: Secondary | ICD-10-CM

## 2013-04-14 MED ORDER — OXYCODONE HCL 30 MG PO TABS: 30.0000 mg | ORAL_TABLET | ORAL | Status: DC | PRN

## 2013-04-14 MED ORDER — PREDNISONE 20 MG PO TABS: ORAL_TABLET | ORAL | Status: DC

## 2013-04-14 MED ORDER — CETIRIZINE HCL 10 MG PO TABS: 10.0000 mg | ORAL_TABLET | Freq: Every day | ORAL | Status: DC

## 2013-04-14 MED ORDER — OXYCODONE HCL ER 40 MG PO T12A: EXTENDED_RELEASE_TABLET | ORAL | Status: DC

## 2013-04-14 MED ORDER — OXYCODONE HCL ER 40 MG PO T12A
EXTENDED_RELEASE_TABLET | ORAL | Status: DC
Start: 2013-04-14 — End: 2013-06-02

## 2013-04-14 NOTE — Progress Notes (Addendum)
Subjective:    Patient ID: Omar Clark, male    DOB: 1980/05/26, 33 y.o.   MRN: [id number] This chart was scribed for Ellamae Siaobert Keary Hanak, MD by Valera CastleSteven Perry, ED Scribe. This patient was seen in room 24 and the patient's care was started at 10:39 AM.  Chief Complaint  Patient presents with  . rash all over    states for 2 weeks getting worse itching  . Back Pain    recheck wants meds refilled pended   HPI Omar Clark is a 33 y.o. male Pt presents with gradually worsening, intermittent, itching, generalized rash, onset 2 weeks ago. 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 He reports it seems as if the rashes come more during Winter months. He reports taking Benadryl as needed for his rashes, with temporary relief. 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 He states his clothes will irritate his skin while working, and sometimes the welts swell up significantly. His recurrences happen about one time per year and usually respond to prednisone  He also presents for medication refill for his h/o back pain. He has appointment scheduled with Dr. Yevette Edwardsumonski.   PCP - No PCP Per Patient  Patient Active Problem List   Diagnosis Date Noted  . Smoker 03/10/2013  . RAD (reactive airway disease) 03/10/2013  . Opiate addiction 11/29/2012  . Lumbar degenerative disc disease 11/29/2012  . Chronic pain syndrome 11/29/2012   Prior to Admission medications   Medication Sig Start Date End Date Taking? Authorizing Provider  albuterol (PROVENTIL HFA;VENTOLIN HFA) 108 (90 BASE) MCG/ACT inhaler Inhale 1-2 puffs into the lungs every 6 (six) hours as needed for wheezing. 02/08/13   Tonye Pearsonobert P Dareion Kneece, MD  cetirizine (ZYRTEC) 10 MG tablet Take 1 tablet (10 mg total) by mouth daily. 01/18/13   Tonye Pearsonobert P Reve Crocket, MD  gabapentin (NEURONTIN) 300 MG capsule Take 1 capsule (300 mg total) by mouth at bedtime. 02/08/13   Tonye Pearsonobert P Zorion Nims, MD    OxyCODONE (OXYCONTIN) 40 mg T12A 12 hr tablet At bedtime. For 03/18/13 03/10/13   Tonye Pearsonobert P Miral Hoopes, MD  oxycodone (ROXICODONE) 30 MG immediate release tablet Take 1 tablet (30 mg total) by mouth every 4 (four) hours as needed for pain. Up to 5 times a day.for 1/15/1/5. 03/10/13   Tonye Pearsonobert P Savier Trickett, MD  varenicline (CHANTIX STARTING MONTH PAK) 0.5 MG X 11 & 1 MG X 42 tablet Take one 0.5 mg tablet by mouth once daily for 3 days, then increase to one 0.5 mg tablet twice daily for 4 days, then increase to one 1 mg tablet twice daily. 02/08/13   Tonye Pearsonobert P Darris Staiger, MD   Review of Systems  Constitutional: Negative for fever.  Musculoskeletal: Positive for back pain.  Skin: Positive for rash (itching, generalized).      Objective:   Physical Exam Nursing note and vitals reviewed. Constitutional: Pt is oriented to person, place, and time. Pt appears well-developed and well-nourished. No distress.  HENT: Right TM nl. Left TM nl. Oropharynx clear and moist, no exudate. Nose nl.  Head: Normocephalic and atraumatic.  Eyes: EOM are normal. Pupils are equal, round, and reactive to light.  Neck: Neck supple. No thyromegaly. No cervical adenopathy.  Cardiovascular: Normal rate, regular rhythm and normal heart sounds.  Exam reveals no gallop and no friction rub. No murmur heard. Pulmonary/Chest: Effort normal and breath sounds normal. No respiratory distress. Pt has no wheezes. Pt has  no rales.  Abdominal: Soft. Bowel sounds are normal. There is no tenderness. There is no rebound and no guarding. No hepatosplenomegaly. No CVA tenderness.  Neurological: Pt is alert and oriented to person, place, and time.  Skin: Skin is warm and dry. Hives on all extremities.  Psychiatric: Pt has a normal mood and affect. Pt's behavior is normal.   BP 130/76  Pulse 94  Temp(Src) 98.1 F (36.7 C)  Resp 18  Ht 5\' 10"  (1.778 m)  Wt 181 lb (82.101 kg)  BMI 25.97 kg/m2  SpO2 97%     Assessment & Plan:  Back pain,  chronic- Lumbar degenerative disc disease-   meds given for the next 8 weeks waiting for orthopedic evaluation  Based on my evaluation as noted at his initial visit , he should have the possibility of getting completely well with  the right orthopedic approach and no longer require chronic pain management  Rash and nonspecific skin eruption-Chronic urticaria for unclear reasons  Zyrtec once or twice a day for the next 2 weeks and if unsuccessful in clearing this may use a six-day taper  prednisone starting at 60 mg   Meds ordered this encounter  Medications  . oxycodone (ROXICODONE) 30 MG immediate release tablet    Sig: Take 1 tablet (30 mg total) by mouth every 4 (four) hours as needed for pain. Up to 5 times a day.for 2/15/1/5.    Dispense:  150 tablet    Refill:  0  . OxyCODONE (OXYCONTIN) 40 mg T12A 12 hr tablet    Sig: At bedtime. For 04/18/13    Dispense:  30 tablet    Refill:  0  . cetirizine (ZYRTEC) 10 MG tablet    Sig: Take 1 tablet (10 mg total) by mouth daily.    Dispense:  30 tablet    Refill:  11  . predniSONE (DELTASONE) 20 MG tablet    Sig: 3/3/2/2/1/1 single daily dose for 6 days    Dispense:  12 tablet    Refill:  0  . oxycodone (ROXICODONE) 30 MG immediate release tablet    Sig: Take 1 tablet (30 mg total) by mouth every 4 (four) hours as needed for pain. Up to 5 times a day.for 3/15/1/5.    Dispense:  150 tablet    Refill:  0  . OxyCODONE (OXYCONTIN) 40 mg T12A 12 hr tablet    Sig: At bedtime. For 05/16/13    Dispense:  30 tablet    Refill:  0    I have completed the patient encounter in its entirety as documented by the scribe, with editing by me where necessary. Jasaun Carn P. Merla Riches, M.D.

## 2013-04-15 ENCOUNTER — Other Ambulatory Visit: Payer: Self-pay | Admitting: Radiology

## 2013-04-15 ENCOUNTER — Telehealth: Payer: Self-pay | Admitting: Radiology

## 2013-04-15 NOTE — Telephone Encounter (Signed)
[facility] called. Patient presents with Rx from you. You changed date to fill to 04/16/13 but when patient presented to pharmacy the date had been changed to 04/15/13. Patient indicates the Rx was changed to 04/15/13 by our receptionist. This is untrue. The date was not altered by anyone here. Please advise, how do you want to handle this situation.

## 2013-04-16 NOTE — Telephone Encounter (Signed)
Patient called 04/15/13 around 5:00 p.m. To speak to someone re: his prescription he had presented to the pharmacy which the pharmacy said appeared altered.  The pharmacy had called and spoken to someone here who could not verify that the altered prescription had been altered by someone in our office.  Patient was very agitated and wanted us to call the pharmacy and clear up the situation and said that he brought the prescription back in and someone had changed it for him due to him having a trip to leave for at 7:00 a.m. 04/16/13.   He said our receptionist was there and witnessed this and she could verify that it was changed.  Spoke with Darl PikesSusan and Amy and they advised that they were aware of the change by Dr. Merla Richesoolittle to allow him to have filled on 04/16/13 but not the change to 04/15/13.  I advised patient that I had spoken to everyone involved and that no one he mentioned would have had the authorization to make that change.  Patient wanted to bring the prescription in to show that it was not his handwriting and I advised him that our office was closed.

## 2013-04-16 NOTE — Telephone Encounter (Signed)
To Dr Merla Richesoolittle for advise.

## 2013-04-17 ENCOUNTER — Ambulatory Visit (INDEPENDENT_AMBULATORY_CARE_PROVIDER_SITE_OTHER): Payer: Self-pay | Admitting: Internal Medicine

## 2013-04-17 VITALS — BP 100/70 | HR 114 | Temp 98.0°F | Resp 16 | Ht 70.0 in | Wt 181.0 lb

## 2013-04-17 DIAGNOSIS — M549 Dorsalgia, unspecified: Secondary | ICD-10-CM

## 2013-04-17 DIAGNOSIS — G8929 Other chronic pain: Secondary | ICD-10-CM

## 2013-04-17 DIAGNOSIS — G894 Chronic pain syndrome: Secondary | ICD-10-CM

## 2013-04-17 MED ORDER — OXYCODONE HCL 30 MG PO TABS: 30.0000 mg | ORAL_TABLET | ORAL | Status: DC | PRN

## 2013-04-17 NOTE — Progress Notes (Addendum)
This chart was scribed for Omar Siaobert Doolittle, MD by Luisa DagoPriscilla Tutu, ED Scribe. This patient was seen in room 2 and the patient's care was started at 8:36 AM. Subjective:    Patient ID: Omar Clark, male    DOB: 1980/03/26, 33 y.o.   MRN: [id number]  Chief Complaint  Patient presents with   Follow-up    questions about meds    HPI HPI Comments: Omar Clark is a 33 y.o. male who was seen by me 3 days ago for gradually worsening, intermittent, itching, generalized rash, onset 2 weeks ago. He also requested medication refill for his back pain. He was prescribed meds for the next 8 weeks while he waits for his orthopedic evaluation. He was also prescribed Zyrtec once or twice a day for his hives for the next 2 weeks and if unsuccessful in clearing this, he may use a six-day taper Prednisone starting at 60 mg.  Today, pt presents to [facility] requesting a follow-up with me. He states that he has some questions about his prescribed medication. Pt states that when he took the prescription to the pharmacy he was told that the prescription needed to be verified by the physician who signed the prescription before the medication can be filled.     Patient Active Problem List   Diagnosis Date Noted   Chronic urticaria 04/14/2013   Smoker 03/10/2013   RAD (reactive airway disease) 03/10/2013   Opiate addiction 11/29/2012   Lumbar degenerative disc disease 11/29/2012   Chronic pain syndrome 11/29/2012   Past Medical History  Diagnosis Date   Chronic back pain    Depression    Past Surgical History  Procedure Laterality Date   Open reduction internal fixation (orif) hand     Allergies  Allergen Reactions   Aspirin Hives   Prior to Admission medications   Medication Sig Start Date End Date Taking? Authorizing Provider  cetirizine (ZYRTEC) 10 MG tablet Take 1 tablet (10 mg total) by mouth daily. 01/18/13  Yes Tonye Pearsonobert P Doolittle, MD  gabapentin  (NEURONTIN) 300 MG capsule Take 1 capsule (300 mg total) by mouth at bedtime. 02/08/13  Yes Tonye Pearsonobert P Doolittle, MD  OxyCODONE (OXYCONTIN) 40 mg T12A 12 hr tablet At bedtime. For 04/18/13 04/14/13  Yes Tonye Pearsonobert P Doolittle, MD  predniSONE (DELTASONE) 20 MG tablet 3/3/2/2/1/1 single daily dose for 6 days 04/14/13  Yes Tonye Pearsonobert P Doolittle, MD  cetirizine (ZYRTEC) 10 MG tablet Take 1 tablet (10 mg total) by mouth daily. 04/14/13   Tonye Pearsonobert P Doolittle, MD  OxyCODONE (OXYCONTIN) 40 mg T12A 12 hr tablet At bedtime. For 05/16/13 04/14/13   Tonye Pearsonobert P Doolittle, MD  oxycodone (ROXICODONE) 30 MG immediate release tablet Take 1 tablet (30 mg total) by mouth every 4 (four) hours as needed for pain. Up to 5 times a day.for 2/15/1/5. 04/14/13   Tonye Pearsonobert P Doolittle, MD  oxycodone (ROXICODONE) 30 MG immediate release tablet Take 1 tablet (30 mg total) by mouth every 4 (four) hours as needed for pain. Up to 5 times a day.for 3/15/1/5. 04/14/13   Tonye Pearsonobert P Doolittle, MD   History   Social History   Marital Status: Married    Spouse Name: N/A    Number of Children: N/A   Years of Education: N/A   Occupational History   Not on file.   Social History Main Topics   Smoking status: Current Every Day Smoker -- 1.00 packs/day    Types: Cigarettes   Smokeless tobacco:  Not on file   Alcohol Use: No   Drug Use: No   Sexual Activity: Not on file   Other Topics Concern   Not on file   Social History Narrative   No narrative on file   Review of Systems     Objective:   Physical Exam  Nursing note and vitals reviewed. Constitutional: He is oriented to person, place, and time. He appears well-developed and well-nourished. No distress.  HENT:  Head: Normocephalic and atraumatic.  Eyes: EOM are normal.  Neck: Neck supple. No tracheal deviation present.  Cardiovascular: Normal rate.   Pulmonary/Chest: Effort normal. No respiratory distress.  Musculoskeletal: Normal range of motion.  Neurological: He is alert and  oriented to person, place, and time.  Skin: Skin is warm and dry.  Psychiatric: He has a normal mood and affect. His behavior is normal.   Filed Vitals:   04/17/13 0812  BP: 100/70  Pulse: 114  Temp: 98 F (36.7 C)  TempSrc: Oral  Resp: 16  Height: 5\' 10"  (1.778 m)  Weight: 181 lb (82.101 kg)  SpO2: 98%          Assessment & Plan:   Back pain, chronic - Plan: oxycodone (ROXICODONE) 30 MG immediate release tablet  Chronic pain syndrome  Meds ordered this encounter  Medications   oxycodone (ROXICODONE) 30 MG immediate release tablet    Sig: Take 1 tablet (30 mg total) by mouth every 4 (four) hours as needed for pain. Up to 5 times a day.for 2/15/1/5.    Dispense:  150 tablet    Refill:  0       I have completed the patient encounter in its entirety as documented by the scribe, with editing by me where necessary. Robert P. Merla Riches, M.D.

## 2013-05-13 NOTE — Telephone Encounter (Signed)
ERROR

## 2013-06-02 ENCOUNTER — Ambulatory Visit: Payer: Self-pay | Admitting: Internal Medicine

## 2013-06-02 ENCOUNTER — Encounter: Payer: Self-pay | Admitting: Internal Medicine

## 2013-06-02 VITALS — BP 117/78 | HR 80 | Temp 98.2°F | Resp 16 | Ht 70.0 in | Wt 185.6 lb

## 2013-06-02 DIAGNOSIS — M549 Dorsalgia, unspecified: Secondary | ICD-10-CM

## 2013-06-02 DIAGNOSIS — G8929 Other chronic pain: Secondary | ICD-10-CM

## 2013-06-02 MED ORDER — OXYCODONE HCL ER 40 MG PO T12A: EXTENDED_RELEASE_TABLET | ORAL | Status: DC

## 2013-06-02 MED ORDER — OXYCODONE HCL 30 MG PO TABS: 30.0000 mg | ORAL_TABLET | ORAL | Status: DC | PRN

## 2013-06-02 MED ORDER — CETIRIZINE HCL 10 MG PO TABS: 10.0000 mg | ORAL_TABLET | Freq: Every day | ORAL | Status: DC

## 2013-06-02 MED ORDER — OXYCODONE HCL 30 MG PO TABS
30.0000 mg | ORAL_TABLET | ORAL | Status: DC | PRN
Start: 2013-06-02 — End: 2013-07-05

## 2013-06-02 NOTE — Patient Instructions (Signed)
For wife---appointment with Benny LennertSarah Weber PA

## 2013-06-02 NOTE — Progress Notes (Signed)
  This chart was scribed for Tonye Pearsonobert P Kendle Turbin, MD by Tana ConchStephen Methvin, ED Scribe. This patient was seen in room 25 and the patient's care was started at 12:03 PM .  Subjective:    Patient ID: Omar Clark, male    DOB: 11-24-1980, 33 y.o.   MRN: [id number]  HPI   HPI Comments: Omar Clark is a 33 y.o. male who [facility] for a medication refill for his chronic back pain that began while in [employer]. Pt reports that since leaving the "pain clinic" and receiving treatment here, he has felt better and believes that the medication regime provide effect. Pt cannot state whether gabapentin provides any effect. Pt appears to have stabilized. Pt reports that he would like his wife to see Dr. Merla Richesoolittle as well for her depression, pt states that his wife no longer wants to be active 9 months after their son was born.  Pt reports that his son was born with swollen kidneys and so the pt missed an appointment scheduled at a orthopedics, the visit had to rescheduled .  Wife ---depr(ref to [employer]ber)  Review of Systems  Constitutional: Positive for activity change.  Musculoskeletal: Positive for back pain.  Psychiatric/Behavioral: Negative for confusion and sleep disturbance.  All other systems reviewed and are negative.       Objective:   Physical Exam  Nursing note and vitals reviewed. Constitutional: He is oriented to person, place, and time. He appears well-developed and well-nourished.  Neurological: He is alert and oriented to person, place, and time.  Psychiatric: He has a normal mood and affect. His behavior is normal. Judgment and thought content normal.       Assessment & Plan:  Back pain, chronic - Plan: oxycodone (ROXICODONE) 30 MG immediate release tablet, OxyCODONE (OXYCONTIN) 40 mg T12A 12 hr tablet, OxyCODONE (OXYCONTIN) 40 mg T12A 12 hr tablet, oxycodone (ROXICODONE) 30 MG immediate release tablet cont neurontin 300hs F/u Murphy-Wainer---he should have  a problem that will be amenable to treatment    Filed Vitals:   06/02/13 1155  BP: 117/78  Pulse: 80  Temp: 98.2 F (36.8 C)  Resp: 16    12:10 AM-Discussed treatment plan which includes refill on his medications with pt at bedside and pt agreed to plan.   .     I personally performed the services described in this documentation, which was scribed in my presence. The recorded information has been reviewed and is accurate.

## 2013-06-15 ENCOUNTER — Ambulatory Visit (INDEPENDENT_AMBULATORY_CARE_PROVIDER_SITE_OTHER): Payer: Self-pay | Admitting: Internal Medicine

## 2013-06-15 VITALS — BP 166/80 | HR 91 | Temp 98.6°F | Resp 16 | Ht 71.0 in | Wt 186.0 lb

## 2013-06-15 DIAGNOSIS — M549 Dorsalgia, unspecified: Secondary | ICD-10-CM

## 2013-06-15 DIAGNOSIS — G8929 Other chronic pain: Secondary | ICD-10-CM

## 2013-06-15 DIAGNOSIS — G894 Chronic pain syndrome: Secondary | ICD-10-CM

## 2013-06-15 MED ORDER — OXYCODONE HCL ER 40 MG PO T12A: EXTENDED_RELEASE_TABLET | ORAL | Status: DC

## 2013-06-15 MED ORDER — CETIRIZINE HCL 10 MG PO TABS: 10.0000 mg | ORAL_TABLET | Freq: Every day | ORAL | Status: DC

## 2013-06-15 MED ORDER — OXYCODONE HCL 30 MG PO TABS
30.0000 mg | ORAL_TABLET | ORAL | Status: DC | PRN
Start: 2013-06-15 — End: 2013-07-05
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
## 2013-07-05 ENCOUNTER — Ambulatory Visit: Payer: Self-pay | Admitting: Internal Medicine

## 2013-07-05 VITALS — BP 112/70 | HR 82 | Temp 98.1°F | Resp 18 | Ht 70.0 in | Wt 185.0 lb

## 2013-07-05 DIAGNOSIS — M549 Dorsalgia, unspecified: Secondary | ICD-10-CM

## 2013-07-05 DIAGNOSIS — G894 Chronic pain syndrome: Secondary | ICD-10-CM

## 2013-07-05 DIAGNOSIS — G8929 Other chronic pain: Secondary | ICD-10-CM

## 2013-07-05 MED ORDER — OXYCODONE HCL ER 40 MG PO T12A: EXTENDED_RELEASE_TABLET | ORAL | Status: DC

## 2013-07-05 MED ORDER — OXYCODONE HCL 15 MG PO TABS: 15.0000 mg | ORAL_TABLET | ORAL | Status: DC | PRN

## 2013-07-05 NOTE — Patient Instructions (Signed)
Recheck early June appointment at 104---we will call with time
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
## 2013-08-04 ENCOUNTER — Telehealth: Payer: Self-pay | Admitting: Radiology

## 2013-08-04 ENCOUNTER — Encounter: Payer: Self-pay | Admitting: Internal Medicine

## 2013-08-04 ENCOUNTER — Ambulatory Visit: Payer: Self-pay | Admitting: Internal Medicine

## 2013-08-04 VITALS — BP 116/76 | HR 82 | Temp 98.1°F | Resp 16 | Ht 70.0 in | Wt 184.2 lb

## 2013-08-04 DIAGNOSIS — M549 Dorsalgia, unspecified: Secondary | ICD-10-CM

## 2013-08-04 DIAGNOSIS — G894 Chronic pain syndrome: Secondary | ICD-10-CM

## 2013-08-04 DIAGNOSIS — F1120 Opioid dependence, uncomplicated: Secondary | ICD-10-CM

## 2013-08-04 DIAGNOSIS — G8929 Other chronic pain: Secondary | ICD-10-CM

## 2013-08-04 DIAGNOSIS — M5136 Other intervertebral disc degeneration, lumbar region: Secondary | ICD-10-CM

## 2013-08-04 MED ORDER — OXYCODONE HCL ER 40 MG PO T12A: EXTENDED_RELEASE_TABLET | ORAL | Status: DC

## 2013-08-04 MED ORDER — OXYCODONE HCL 15 MG PO TABS: 15.0000 mg | ORAL_TABLET | ORAL | Status: DC | PRN

## 2013-08-04 NOTE — Progress Notes (Signed)
followup Back pain, chronic - Chronic pain syndrome Lumbar degenerative disc disease   Doing well on meds awaiting ortho eval for MRI and possible interventions to get him off pain meds if possible  Exam BP 116/76  Pulse 82  Temp(Src) 98.1 F (36.7 C) (Oral)  Resp 16  Ht 5\' 10"  (1.778 m)  Wt 184 lb 3.2 oz (83.553 kg)  BMI 26.43 kg/m2  SpO2 97% No change in findings  Plan Meds ordered this encounter  Medications  . OxyCODONE (OXYCONTIN) 40 mg T12A 12 hr tablet    Sig: At bedtime    Dispense:  30 tablet    Refill:  0  . oxyCODONE (ROXICODONE) 15 MG immediate release tablet    Sig: Take 1-2 tablets (15-30 mg total) by mouth every 4 (four) hours as needed for pain.    Dispense:  300 tablet    Refill:  0  . OxyCODONE (OXYCONTIN) 40 mg T12A 12 hr tablet    Sig: At bedtime. For 1 mo after date signed    Dispense:  30 tablet    Refill:  0  . oxyCODONE (ROXICODONE) 15 MG immediate release tablet    Sig: Take 1-2 tablets (15-30 mg total) by mouth every 4 (four) hours as needed for pain. For 1 month after date signed    Dispense:  300 tablet    Refill:  0    F/u 2 mon F/u w/ ortho

## 2013-08-04 NOTE — Telephone Encounter (Signed)
Phone call to Dr Yevette Edwards office to check on the referral for patient. appt has not been scheduled for 11 :15 June 12th he should arrive at 10:30 and he should bring $250 with him for the appointment. I have left message for patient to advise of the appointment, Lupita Leash will you please make sure he gets another call about this appointment? Dr Merla Riches, this is to you FYI

## 2013-08-05 ENCOUNTER — Telehealth: Payer: Self-pay

## 2013-08-05 NOTE — Telephone Encounter (Signed)
PA needed for Oxycontin. Completed form and faxed to [facility].

## 2013-08-20 ENCOUNTER — Telehealth: Payer: Self-pay

## 2013-08-20 NOTE — Telephone Encounter (Signed)
Patient states he needs a refill on Prednisone for his hives. Please advise.

## 2013-08-23 NOTE — Telephone Encounter (Signed)
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 They are aggravated by stress and his nerves. Advised pt to RTC for evaluation. Pt agrees to come in tomorrow.

## 2013-09-29 ENCOUNTER — Ambulatory Visit: Payer: Self-pay | Admitting: Internal Medicine

## 2013-10-11 ENCOUNTER — Ambulatory Visit (INDEPENDENT_AMBULATORY_CARE_PROVIDER_SITE_OTHER): Payer: Medicaid Other | Admitting: Internal Medicine

## 2013-10-11 VITALS — BP 120/78 | HR 122 | Temp 98.2°F | Resp 16 | Ht 69.5 in | Wt 179.8 lb

## 2013-10-11 DIAGNOSIS — M5137 Other intervertebral disc degeneration, lumbosacral region: Secondary | ICD-10-CM

## 2013-10-11 DIAGNOSIS — M5136 Other intervertebral disc degeneration, lumbar region: Secondary | ICD-10-CM

## 2013-10-11 DIAGNOSIS — M549 Dorsalgia, unspecified: Secondary | ICD-10-CM

## 2013-10-11 DIAGNOSIS — G8929 Other chronic pain: Secondary | ICD-10-CM

## 2013-10-11 DIAGNOSIS — G894 Chronic pain syndrome: Secondary | ICD-10-CM

## 2013-10-11 MED ORDER — OXYCODONE HCL ER 40 MG PO T12A: EXTENDED_RELEASE_TABLET | ORAL | Status: DC

## 2013-10-11 MED ORDER — OXYCODONE HCL 15 MG PO TABS: 15.0000 mg | ORAL_TABLET | ORAL | Status: DC | PRN

## 2013-10-11 MED ORDER — GABAPENTIN 300 MG PO CAPS: 600.0000 mg | ORAL_CAPSULE | Freq: Every day | ORAL | Status: DC

## 2013-10-11 NOTE — Progress Notes (Signed)
Subjective:    Patient ID: Omar Clark, male    DOB: 1980/07/13, 33 y.o.   MRN: [id number] This chart was scribed for [name], MD by Gwenevere Abbot, ED scribe. This patient was seen in room Room/bed 02 and the patient's care was started at 4:20 PM.   HPI Chief Complaint  Patient presents with  . Medication Refill    Gabapetin, oxycontin, roxicodone    HPI Comments:  Omar Clark is a 33 y.o. male who presents to [facility] seeking a medication refill. He continues to struggle with chronic pain. Patient Active Problem List   Diagnosis Date Noted  . Chronic pain syndrome 11/29/2012    Priority: Medium  . Chronic urticaria 04/14/2013  . Smoker 03/10/2013  . RAD (reactive airway disease) 03/10/2013  . Opiate addiction 11/29/2012  . Lumbar degenerative disc disease 11/29/2012   see extensive past history related to his back--professional volleyball with injuries///chronic pain treatment including [facility]///orthopedic evaluations with a diagnosis of degenerative disc disease. He discontinued chronic pain management because he wanted to find a way to improve his condition. He remains stable on his medications and has not yet managed his orthopedic referral.  his insurance company refuses an MRI of his back if ordered by me.  Review of Systems Noncontributory    Objective:   Physical Exam  Nursing note and vitals reviewed. Constitutional: He is oriented to person, place, and time. He appears well-developed and well-nourished. No distress.  Neck: Normal range of motion. Neck supple.  Cardiovascular: Normal rate.   Pulmonary/Chest: Effort normal.  Neurological: He is alert and oriented to person, place, and time.  Back exam unchanged  Skin: Skin is warm and dry.  Psychiatric: He has a normal mood and affect. His behavior is normal.    BP 120/78  Pulse 122  Temp(Src) 98.2 F (36.8 C) (Oral)  Resp 16  Ht 5' 9.5" (1.765 m)  Wt 179 lb 12.8 oz (81.557 kg)  BMI 26.18  kg/m2  SpO2 98% Recheck of his pulse during the exam reveals a rate of 70      Assessment & Plan:  I have completed the patient encounter in its entirety as documented by the scribe, with editing by me where necessary. Jatniel Verastegui P. Merla Riches, M.D. Chronic pain syndrome  Lumbar degenerative disc disease  Back pain, chronic - Plan: OxyCODONE (OXYCONTIN) 40 mg T12A 12 hr tablet, OxyCODONE (OXYCONTIN) 40 mg T12A 12 hr tablet, oxyCODONE (ROXICODONE) 15 MG immediate release tablet, gabapentin (NEURONTIN) 300 MG capsule  Meds ordered this encounter  Medications  . OxyCODONE (OXYCONTIN) 40 mg T12A 12 hr tablet    Sig: At bedtime. For 1 mo after date signed    Dispense:  30 tablet    Refill:  0  . OxyCODONE (OXYCONTIN) 40 mg T12A 12 hr tablet    Sig: At bedtime    Dispense:  30 tablet    Refill:  0  . oxyCODONE (ROXICODONE) 15 MG immediate release tablet    Sig: Take 1-2 tablets (15-30 mg total) by mouth every 4 (four) hours as needed for pain. For 1 month after date signed    Dispense:  300 tablet    Refill:  0  . oxyCODONE (ROXICODONE) 15 MG immediate release tablet    Sig: Take 1-2 tablets (15-30 mg total) by mouth every 4 (four) hours as needed for pain.    Dispense:  300 tablet    Refill:  0  . gabapentin (NEURONTIN) 300 MG capsule  Sig: Take 2 capsules (600 mg total) by mouth at bedtime.    Dispense:  60 capsule    Refill:  5   We will once again try to establish an orthopedic referral

## 2013-10-31 ENCOUNTER — Ambulatory Visit (INDEPENDENT_AMBULATORY_CARE_PROVIDER_SITE_OTHER): Payer: Self-pay | Admitting: Internal Medicine

## 2013-10-31 VITALS — BP 122/84 | HR 104 | Temp 97.9°F | Resp 18 | Ht 70.0 in | Wt 180.4 lb

## 2013-10-31 DIAGNOSIS — R1013 Epigastric pain: Secondary | ICD-10-CM

## 2013-10-31 DIAGNOSIS — G894 Chronic pain syndrome: Secondary | ICD-10-CM

## 2013-10-31 DIAGNOSIS — M5137 Other intervertebral disc degeneration, lumbosacral region: Secondary | ICD-10-CM

## 2013-10-31 DIAGNOSIS — L508 Other urticaria: Secondary | ICD-10-CM

## 2013-10-31 DIAGNOSIS — M549 Dorsalgia, unspecified: Secondary | ICD-10-CM

## 2013-10-31 DIAGNOSIS — G8929 Other chronic pain: Secondary | ICD-10-CM

## 2013-10-31 DIAGNOSIS — M5136 Other intervertebral disc degeneration, lumbar region: Secondary | ICD-10-CM

## 2013-10-31 DIAGNOSIS — K3189 Other diseases of stomach and duodenum: Secondary | ICD-10-CM

## 2013-10-31 MED ORDER — HYDROXYZINE HCL 25 MG PO TABS: 25.0000 mg | ORAL_TABLET | Freq: Four times a day (QID) | ORAL | Status: DC | PRN

## 2013-10-31 MED ORDER — PREDNISONE 20 MG PO TABS: ORAL_TABLET | ORAL | Status: DC

## 2013-10-31 MED ORDER — OXYCODONE HCL ER 60 MG PO T12A: 60.0000 mg | EXTENDED_RELEASE_TABLET | Freq: Two times a day (BID) | ORAL | Status: DC

## 2013-10-31 MED ORDER — OMEPRAZOLE 40 MG PO CPDR: 40.0000 mg | DELAYED_RELEASE_CAPSULE | Freq: Every day | ORAL | Status: DC

## 2013-10-31 MED ORDER — OXYCODONE HCL ER 40 MG PO T12A: EXTENDED_RELEASE_TABLET | ORAL | Status: DC

## 2013-10-31 MED ORDER — DOXEPIN HCL 75 MG PO CAPS: 75.0000 mg | ORAL_CAPSULE | Freq: Every day | ORAL | Status: DC

## 2013-10-31 NOTE — Patient Instructions (Signed)
Dr Norberto Sorenson [facility] [address] Warner Mccreedy Call [phone] for appt

## 2013-11-01 NOTE — Progress Notes (Signed)
Subjective:    Patient ID: Omar Clark, male    DOB: 04/24/1980, 33 y.o.   MRN: [id number]  HPI  Chief Complaint  Patient presents with  . Urticaria    noticed it past 3 to 4 days--no new food--no new meds He has a history of chronic urticaria with multiple relapses for many years  This sometimes seems related to stress and at other times is unclear  No underlying cause has ever been identified     -  chronic pain not well controlled  Has appointment degrees per orthopedics but his insurance will not cover it because he was not referred by the  person listed as his PCP on his Medicaid card--Dr. Windle Guard  We are well below his prior dose of oxycodone use over the last 8-10 years  He unfortunately has not improved enough to decrease it  He would prefer a life not dominated by narcotics and for this reason we are pursuing orthopedic intervention     -  He has pretty constant abdominal discomfort with reflux and irritation/wakes frequently at night with stomach  discomfort/his wife notices that he takes 12 ibuprofen every day at least and sometimes more    Review of Systems No fever chills night sweats No vomiting No chest pain No urinary difficulties He feels some stress with his kidsx3    Objective:   Physical Exam BP 122/84  Pulse 104  Temp(Src) 97.9 F (36.6 C) (Oral)  Resp 18  Ht  (1.778 m)  Wt 180 lb 6.4 oz (81.829 kg)  BMI 25.88 kg/m2  SpO2 97% In no acute distress He has multiple hives on his torso and extremity without vesicular lesions, petechiae, palpable purpura No intraoral lesions No adenopathy or thyromegaly Lungs clear Heart regular Joints are nonswollen       Assessment & Plan:  Back pain, chronic - Plan: OxyCODONE 60 MG T12A, DISCONTINUED: OxyCODONE (OXYCONTIN) 40 mg T12A 12 hr tablet  Chronic pain syndrome  Chronic urticaria  Lumbar degenerative disc disease  Dyspepsia  Discontinue ibuprofen Start omeprazole Increase  oxycodonexr to 60 and bid-(hopefully this will lessen the when necessary dosing of 15-30 mg) to try to control pain 2 [facility] to try and obtain a referral to orthopedics zyrtec 2 tablets a day Vistaril Doxepin at bedtime Dosepak which has been successful and often required in the past Followup is planned  Meds ordered this encounter  Medications  . omeprazole (PRILOSEC) 40 MG capsule    Sig: Take 1 capsule (40 mg total) by mouth daily.    Dispense:  30 capsule    Refill:  3  . doxepin (SINEQUAN) 75 MG capsule    Sig: Take 1 capsule (75 mg total) by mouth at bedtime.    Dispense:  30 capsule    Refill:  5  . OxyCODONE 60 MG T12A    Sig: Take 60 mg by mouth every 12 (twelve) hours. At bedtime    Dispense:  60 tablet    Refill:  0  . DISCONTD: OxyCODONE (OXYCONTIN) 40 mg T12A 12 hr tablet    Sig: At bedtime. For 1 mo after date signed    Dispense:  30 tablet    Refill:  0  . predniSONE (DELTASONE) 20 MG tablet    Sig: 3/3/2/2/1/1 single daily dose for 6 days    Dispense:  12 tablet    Refill:  0  . hydrOXYzine (ATARAX/VISTARIL) 25 MG tablet  Sig: Take 1 tablet (25 mg total) by mouth every 6 (six) hours as needed for itching.    Dispense:  120 tablet    Refill:  5

## 2013-11-03 ENCOUNTER — Ambulatory Visit: Payer: Self-pay | Admitting: Internal Medicine

## 2013-11-05 NOTE — Telephone Encounter (Signed)
Received another notice from pharm that PA is needed. I never heard back from the form submitted 08/05/13. I resent completed form to [facility] w/confirmation again and notified pharm of status. Pending.

## 2013-11-26 NOTE — Telephone Encounter (Signed)
Yes change from 40 to 60

## 2013-11-26 NOTE — Telephone Encounter (Signed)
I still hadn't heard back from ins after resubmitting PA and checked status w/ins. Pharm stated that the 40 mg does go through but wanted [place] to check w/Dr Merla Riches to see if this strength should be discontinued since he Rxd the 60 mg. Dr Merla Riches it looks like in your 8/30 notes you "increased oxycodone XR to 60 mg". You don't specifically state that you want to DC the 40 mg Rx, but it looks like that is what you intended. Please confirm and I will let pharm know.

## 2013-11-29 NOTE — Telephone Encounter (Signed)
Notified pharm. 

## 2013-12-05 ENCOUNTER — Ambulatory Visit (INDEPENDENT_AMBULATORY_CARE_PROVIDER_SITE_OTHER): Payer: Self-pay | Admitting: Internal Medicine

## 2013-12-05 VITALS — BP 122/80 | HR 98 | Temp 98.8°F | Resp 20 | Ht 70.0 in | Wt 192.4 lb

## 2013-12-05 DIAGNOSIS — G8929 Other chronic pain: Secondary | ICD-10-CM

## 2013-12-05 DIAGNOSIS — R059 Cough, unspecified: Secondary | ICD-10-CM

## 2013-12-05 DIAGNOSIS — G894 Chronic pain syndrome: Secondary | ICD-10-CM

## 2013-12-05 DIAGNOSIS — R05 Cough: Secondary | ICD-10-CM

## 2013-12-05 DIAGNOSIS — M549 Dorsalgia, unspecified: Secondary | ICD-10-CM

## 2013-12-05 MED ORDER — OXYCODONE HCL ER 60 MG PO T12A: 60.0000 mg | EXTENDED_RELEASE_TABLET | Freq: Two times a day (BID) | ORAL | Status: DC

## 2013-12-05 MED ORDER — AMOXICILLIN 500 MG PO CAPS: 1000.0000 mg | ORAL_CAPSULE | Freq: Two times a day (BID) | ORAL | Status: AC

## 2013-12-05 MED ORDER — OXYCODONE HCL 30 MG PO TABS: 45.0000 mg | ORAL_TABLET | ORAL | Status: DC | PRN

## 2013-12-05 MED ORDER — PROMETHAZINE-DM 6.25-15 MG/5ML PO SYRP: 5.0000 mL | ORAL_SOLUTION | Freq: Four times a day (QID) | ORAL | Status: DC | PRN

## 2013-12-05 NOTE — Progress Notes (Signed)
Subjective:    Patient ID: Omar Clark, male    DOB: 02-05-1981, 33 y.o.   MRN: [id number] This chart was scribed for Fama Muenchow P. Merla Richesoolittle, MD by Chestine SporeSoijett Blue, ED Scribe. The patient was seen in room 11 at 2:06 PM.   Chief Complaint  Patient presents with  . Medication Refill    Oxycodone    HPI Omar Clark is a 33 y.o. male with a medical hx of opiate addiction, chronic back pain, chronic pain syndrome, and Lumbar degenerative disc disease who presents today complaining of medication refill of his Oxycodone. He thinks that he did better with the higher dose of Oxycodone. He states that he still needs two 15's every four to five hours. He states that he took one 30 and a 15 one time and felt better. He voices concern if we increase the dose for how long it will take for a higher dose of Oxycodone to be approved, because the recent higher doses took up to 2 months to approve.   He states that he has not been feeling well 4 weeks and he is coughing to where he is being kept up at night. He states that he is having associated symptoms of cough and congestion. He states that the cough "taste" different to him. He states that he is coughing up balls that are perfectly round and he has never done that before. He denies fever and any other associated symptoms.  He states that last year he saw a doctor that did a blood test on him and he had a low vitamin D of 19 and was given 2500 mg of Vitamin D supplement. 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 He states that he has [employer] [place].   He has an appointment w/ Dr. [name] on Wednesday this week to see if he can obtain approval from him for an orthopedic referral which would thus be covered under his Medicaid insurance    Patient Active Problem List   Diagnosis Date Noted  . Chronic urticaria 04/14/2013  . Smoker 03/10/2013  . RAD (reactive airway  disease) 03/10/2013  . Opiate addiction 11/29/2012  . Lumbar degenerative disc disease 11/29/2012  . Chronic pain syndrome 11/29/2012   Past Medical History  Diagnosis Date  . Chronic back pain   . Depression    Past Surgical History  Procedure Laterality Date  . Open reduction internal fixation (orif) hand     Allergies  Allergen Reactions  . Aspirin Hives   Prior to Admission medications   Medication Sig Start Date End Date Taking? Authorizing Provider  doxepin (SINEQUAN) 75 MG capsule Take 1 capsule (75 mg total) by mouth at bedtime. 10/31/13   Tonye Pearsonobert P Evangeline Utley, MD  gabapentin (NEURONTIN) 300 MG capsule Take 2 capsules (600 mg total) by mouth at bedtime. 10/11/13   Tonye Pearsonobert P Veora Fonte, MD  hydrOXYzine (ATARAX/VISTARIL) 25 MG tablet Take 1 tablet (25 mg total) by mouth every 6 (six) hours as needed for itching. 10/31/13   Tonye Pearsonobert P Autumn Gunn, MD  omeprazole (PRILOSEC) 40 MG capsule Take 1 capsule (40 mg total) by mouth daily. 10/31/13   Tonye Pearsonobert P Elva Mauro, MD  OxyCODONE 60 MG T12A Take 60 mg by mouth every 12 (twelve) hours. At bedtime 10/31/13   Tonye Pearsonobert P Shereena Berquist, MD  Oxy 15mg     q4h prn  10/2013    Merla Richesoolittle  Review of Systems  Constitutional: Negative for fever.       Medication refill  HENT: Positive for congestion.   Respiratory: Positive for cough.        Objective:   Physical Exam  Nursing note and vitals reviewed. Constitutional: He is oriented to person, place, and time. He appears well-developed and well-nourished. No distress.  HENT:  Head: Normocephalic and atraumatic.  Right Ear: External ear normal.  Left Ear: External ear normal.  Mouth/Throat: Oropharynx is clear and moist.  Boggy turbs w/ purulent d/c bilat  Eyes: Conjunctivae and EOM are normal. Pupils are equal, round, and reactive to light.  Neck: Neck supple.  Cardiovascular: Normal rate, regular rhythm and normal heart sounds.   Pulmonary/Chest: Effort normal. No respiratory distress.  Scattered  rhonchi R>L but clears with cough  Musculoskeletal: Normal range of motion.  Lymphadenopathy:    He has no cervical adenopathy.  Neurological: He is alert and oriented to person, place, and time.  Skin: Skin is warm and dry.  Psychiatric: He has a normal mood and affect. His behavior is normal.       BP 122/80  Pulse 98  Temp(Src) 98.8 F (37.1 C) (Oral)  Resp 20  Ht 5\' 10"  (1.778 m)  Wt 192 lb 6 oz (87.261 kg)  BMI 27.60 kg/m2  SpO2 96%   Assessment & Plan:  COORDINATION OF CARE: 2:19 PM-Discussed treatment plan which includes amoxicillin and medication refill with pt at bedside and pt agreed to plan.    I personally performed the services described in this documentation, which was scribed in my presence. The recorded information has been reviewed and is accurate.  Chronic pain syndrome  Back pain, chronic  Cough  Meds ordered this encounter  Medications  . : oxycodone (ROXICODONE) 30 MG immediate release tablet    Sig: Take 1.5 tablets (45 mg total) by mouth every 4 (four) hours as needed for pain.    Dispense:  270 tablet    Refill:  0  . OxyCODONE HCl ER 60 MG T12A    Sig: Take 60 mg by mouth 2 (two) times daily.    Dispense:  60 each    Refill:  0  . amoxicillin (AMOXIL) 500 MG capsule    Sig: Take 2 capsules (1,000 mg total) by mouth 2 (two) times daily.    Dispense:  40 capsule    Refill:  0  . promethazine-dextromethorphan (PROMETHAZINE-DM) 6.25-15 MG/5ML syrup    Sig: Take 5-10 mLs by mouth 4 (four) times daily as needed for cough.    Dispense:  180 mL    Refill:  0  . oxycodone (ROXICODONE) 30 MG immediate release tablet    Sig: Take 1.5 tablets (45 mg total) by mouth every 4 (four) hours as needed for pain. For 30d after signed    Dispense:  270 tablet    Refill:  0  . OxyCODONE HCl ER 60 MG T12A    Sig: Take 60 mg by mouth 2 (two) times daily. For 30d after signed    Dispense:  60 each    Refill:  0   F/u 2 mos Establish orthopedic treatment  plan thru PCP Jeannetta Nap

## 2013-12-08 ENCOUNTER — Telehealth: Payer: Self-pay

## 2013-12-08 NOTE — Telephone Encounter (Signed)
Ask Christianne BorrowBarabara about this as we did not change anything at last OV

## 2013-12-08 NOTE — Telephone Encounter (Signed)
Pt calling to check on the status of prior authorization on oxycodone.  Best# [id number]

## 2013-12-08 NOTE — Telephone Encounter (Signed)
I am not aware of any.

## 2013-12-09 NOTE — Telephone Encounter (Signed)
PA submitted for Oxycontin 60mg  tablets

## 2013-12-10 NOTE — Telephone Encounter (Signed)
I called [place] [facility] to inquire why the existing PA is not working for pt's Rx and was advised that the PA in place is for #30 for 30 days. Pt's dose was increased to BID. Started completing form but need answers on med hx from pt. Called pt and LMOM to CB. Form is at my desk in alph pending folder.

## 2013-12-10 NOTE — Telephone Encounter (Signed)
Faxed in completed form. Pending.

## 2013-12-13 ENCOUNTER — Telehealth: Payer: Self-pay

## 2013-12-13 NOTE — Telephone Encounter (Signed)
otc zyrtec twice a day

## 2013-12-13 NOTE — Telephone Encounter (Signed)
Pt states he has broken out in hives and is requesting dr Merla Richesdoolittle to prescribe something for him

## 2013-12-14 NOTE — Telephone Encounter (Signed)
Pt advised.

## 2014-01-21 NOTE — Telephone Encounter (Signed)
[facility] sent denial of PA for BID dosing. Notified pharm

## 2014-02-01 ENCOUNTER — Ambulatory Visit (INDEPENDENT_AMBULATORY_CARE_PROVIDER_SITE_OTHER): Payer: Self-pay | Admitting: Internal Medicine

## 2014-02-01 VITALS — BP 116/78 | HR 94 | Temp 99.0°F | Resp 18 | Ht 71.0 in | Wt 181.0 lb

## 2014-02-01 DIAGNOSIS — M5136 Other intervertebral disc degeneration, lumbar region: Secondary | ICD-10-CM

## 2014-02-01 DIAGNOSIS — L508 Other urticaria: Secondary | ICD-10-CM

## 2014-02-01 DIAGNOSIS — G894 Chronic pain syndrome: Secondary | ICD-10-CM

## 2014-02-01 MED ORDER — OXYCODONE HCL 30 MG PO TABS: 45.0000 mg | ORAL_TABLET | ORAL | Status: DC | PRN

## 2014-02-01 MED ORDER — OXYCODONE HCL ER 60 MG PO T12A: 60.0000 mg | EXTENDED_RELEASE_TABLET | Freq: Two times a day (BID) | ORAL | Status: DC

## 2014-02-01 NOTE — Progress Notes (Signed)
Subjective:  This chart was scribed for Tonye Pearsonobert P Koa Palla, MD by Charline BillsEssence Howell, ED Scribe. The patient was seen in room 14. Patient's care was started at 5:38 PM.   Patient ID: Omar Clark, male    DOB: 05/15/1980, 33 y.o.   MRN: [id number]  Chief Complaint  Patient presents with  . rx refills    oxycodone, oxycotin   HPI HPI Comments: Omar Clark is a 33 y.o. male with a h/o chronic back pain and depression, who presents to the [facility] for medication refills of Oxycodone and Oxycotin. Pt reports that he had an appointment with [facility] scheduled but he had to cancel. He states that he has left voice messages in attempts to reschedule the appointment but he has not received a call back.  Rash Pt also presents an itchy rash to bilateral legs. He states that rash resembles urticaria but later becomes bruises. Pt reports that bruising resolves after 2-3 days. He denies bruises at this time. Pt has tried Zyrtec with relief.   Sleep Disturbances  Pt reports that sleep disturbances have improved significantly since starting Neurontin. Pt takes Neurontin nightly. He reports that he now only wakes up 1-2 times a night but woke up  4-5 times prior to Neurontin.   Past Medical History  Diagnosis Date  . Chronic back pain   . Depression    Current Outpatient Prescriptions on File Prior to Visit  Medication Sig Dispense Refill  . doxepin (SINEQUAN) 75 MG capsule Take 1 capsule (75 mg total) by mouth at bedtime. 30 capsule 5  . gabapentin (NEURONTIN) 300 MG capsule Take 2 capsules (600 mg total) by mouth at bedtime. 60 capsule 5  . hydrOXYzine (ATARAX/VISTARIL) 25 MG tablet Take 1 tablet (25 mg total) by mouth every 6 (six) hours as needed for itching. 120 tablet 5  . omeprazole (PRILOSEC) 40 MG capsule Take 1 capsule (40 mg total) by mouth daily. 30 capsule 3  . oxycodone (ROXICODONE) 30 MG immediate release tablet Take 1.5 tablets (45 mg total) by  mouth every 4 (four) hours as needed for pain. For 30d after signed 270 tablet 0  . OxyCODONE HCl ER 60 MG T12A Take 60 mg by mouth 2 (two) times daily. For 30d after signed 60 each 0  . promethazine-dextromethorphan (PROMETHAZINE-DM) 6.25-15 MG/5ML syrup Take 5-10 mLs by mouth 4 (four) times daily as needed for cough. (Patient not taking: Reported on 02/01/2014) 180 mL 0   No current facility-administered medications on file prior to visit.   Allergies  Allergen Reactions  . Aspirin Hives   Review of Systems  Constitutional: Negative for fever, chills and fatigue.  Skin: Positive for rash.  Psychiatric/Behavioral: Positive for sleep disturbance.      Objective:   Physical Exam  Constitutional: He is oriented to person, place, and time. He appears well-developed and well-nourished. No distress.  HENT:  Head: Normocephalic and atraumatic.  Eyes: Conjunctivae and EOM are normal.  Neck: Neck supple. Tracheal deviation present.  Cardiovascular: Normal rate.   Pulmonary/Chest: Effort normal. No respiratory distress.  Musculoskeletal: Normal range of motion.  Neurological: He is alert and oriented to person, place, and time.  Skin: Skin is warm and dry.  Psychiatric: He has a normal mood and affect. His behavior is normal.  Nursing note and vitals reviewed.     Assessment & Plan:   I personally performed the services described in this documentation, which was scribed in my presence.  The recorded information has been reviewed and is accurate. Chronic pain syndrome  Chronic urticaria  Lumbar degenerative disc disease  Meds ordered this encounter  Medications  . oxycodone (ROXICODONE) 30 MG immediate release tablet    Sig: Take 1.5 tablets (45 mg total) by mouth every 4 (four) hours as needed for pain. For 30d after signed    Dispense:  270 tablet    Refill:  0  . OxyCODONE HCl ER 60 MG T12A    Sig: Take 60 mg by mouth 2 (two) times daily. For 30d after signed    Dispense:  60  each    Refill:  0  . oxycodone (ROXICODONE) 30 MG immediate release tablet    Sig: Take 1.5 tablets (45 mg total) by mouth every 4 (four) hours as needed for pain.    Dispense:  270 tablet    Refill:  0  . OxyCODONE HCl ER 60 MG T12A    Sig: Take 60 mg by mouth 2 (two) times daily.    Dispense:  60 each    Refill:  0   He has another 2 months of medication which should allow for orthopedic evaluation. The question is, is there a physical modality or surgery that would prevent his need for chronic pain medicines or should he be relegated to a pain management program. My evaluation suggests that intervention is possible.

## 2014-03-29 ENCOUNTER — Ambulatory Visit (INDEPENDENT_AMBULATORY_CARE_PROVIDER_SITE_OTHER): Payer: Self-pay | Admitting: Internal Medicine

## 2014-03-29 VITALS — BP 110/62 | HR 69 | Temp 98.0°F | Resp 18 | Wt 184.0 lb

## 2014-03-29 DIAGNOSIS — M5136 Other intervertebral disc degeneration, lumbar region: Secondary | ICD-10-CM

## 2014-03-29 DIAGNOSIS — G894 Chronic pain syndrome: Secondary | ICD-10-CM

## 2014-03-29 DIAGNOSIS — L508 Other urticaria: Secondary | ICD-10-CM

## 2014-03-29 MED ORDER — OXYCODONE HCL ER 60 MG PO T12A: 60.0000 mg | EXTENDED_RELEASE_TABLET | Freq: Two times a day (BID) | ORAL | Status: DC

## 2014-03-29 MED ORDER — OXYCODONE HCL 30 MG PO TABS: 45.0000 mg | ORAL_TABLET | ORAL | Status: DC | PRN

## 2014-03-29 MED ORDER — FEXOFENADINE HCL 180 MG PO TABS: ORAL_TABLET | ORAL | Status: DC

## 2014-03-29 MED ORDER — OMEPRAZOLE 40 MG PO CPDR: 40.0000 mg | DELAYED_RELEASE_CAPSULE | Freq: Every day | ORAL | Status: DC

## 2014-03-29 NOTE — Progress Notes (Signed)
   Subjective:    Patient ID: Omar Clark, male    DOB: 1980-06-11, 34 y.o.   MRN: [id number]  HPI here for follow-up  Patient Active Problem List   Diagnosis Date Noted  . Chronic urticaria--- he is finally discovered that to Allegra daily taken 180 mg twice a day to control his recurrent hives as well as his allergic symptoms  04/14/2013    Priority: Medium  . Chronic pain syndrome----his activity level has improved on his current pain regimen but we have still not managed to get him an orthopedic appointment for further evaluation for variety of reasons. He now has coverage by Medicaid which should make this referral easier.  11/29/2012    Priority: Medium  . Smoker----he has decreased his smoking over the past 2 months  03/10/2013  . RAD (reactive airway disease)--- currently asymptomatic  03/10/2013  . Opiate use for chronic pain  11/29/2012  . Lumbar degenerative disc disease causing chronic pain  11/29/2012       Review of Systems Noncontributory     Objective:   Physical Exam BP 110/62 mmHg  Pulse 69  Temp(Src) 98 F (36.7 C) (Oral)  Resp 18  Wt 184 lb (83.462 kg)  SpO2 99% No changes from the past       Assessment & Plan:  Lumbar degenerative disc disease - Plan: Ambulatory referral to Orthopedic Surgery  Chronic pain syndrome  Chronic urticaria  Meds ordered this encounter  Medications  . fexofenadine (ALLEGRA) 180 MG tablet    Sig: 1 tablet bid for chronic urticaria    Dispense:  60 tablet    Refill:  11  . oxycodone (ROXICODONE) 30 MG immediate release tablet    Sig: Take 1.5 tablets (45 mg total) by mouth every 4 (four) hours as needed for pain.    Dispense:  270 tablet    Refill:  0  . OxyCODONE HCl ER 60 MG T12A    Sig: Take 60 mg by mouth 2 (two) times daily.    Dispense:  60 each    Refill:  0  . omeprazole (PRILOSEC) 40 MG capsule    Sig: Take 1 capsule (40 mg total) by mouth daily.    Dispense:  30 capsule    Refill:  11  .  OxyCODONE HCl ER 60 MG T12A    Sig: Take 60 mg by mouth 2 (two) times daily. For 30d after signed    Dispense:  60 each    Refill:  0  . oxycodone (ROXICODONE) 30 MG immediate release tablet    Sig: Take 1.5 tablets (45 mg total) by mouth every 4 (four) hours as needed for pain. For 30d after signed    Dispense:  270 tablet    Refill:  0   Once again it is my belief that he was relegated to chronic pain therapy before having a complete orthopedic evaluation and that a surgical cure with no need for pain medication is distinctly possible for him--- I will re-refer to [facility]

## 2014-05-27 ENCOUNTER — Ambulatory Visit (INDEPENDENT_AMBULATORY_CARE_PROVIDER_SITE_OTHER): Payer: Self-pay | Admitting: Internal Medicine

## 2014-05-27 VITALS — BP 132/82 | HR 116 | Temp 98.5°F | Resp 17 | Ht 70.0 in | Wt 179.0 lb

## 2014-05-27 DIAGNOSIS — M5136 Other intervertebral disc degeneration, lumbar region: Secondary | ICD-10-CM

## 2014-05-27 DIAGNOSIS — M549 Dorsalgia, unspecified: Secondary | ICD-10-CM

## 2014-05-27 DIAGNOSIS — G894 Chronic pain syndrome: Secondary | ICD-10-CM

## 2014-05-27 DIAGNOSIS — G8929 Other chronic pain: Secondary | ICD-10-CM

## 2014-05-27 MED ORDER — OMEPRAZOLE 20 MG PO CPDR: 20.0000 mg | DELAYED_RELEASE_CAPSULE | Freq: Every day | ORAL | Status: DC

## 2014-05-27 MED ORDER — OXYCODONE HCL 30 MG PO TABS: 45.0000 mg | ORAL_TABLET | ORAL | Status: DC | PRN

## 2014-05-27 MED ORDER — OXYCODONE HCL ER 60 MG PO T12A: 60.0000 mg | EXTENDED_RELEASE_TABLET | Freq: Two times a day (BID) | ORAL | Status: DC

## 2014-05-27 MED ORDER — GABAPENTIN 300 MG PO CAPS: 600.0000 mg | ORAL_CAPSULE | Freq: Every day | ORAL | Status: DC

## 2014-05-27 NOTE — Progress Notes (Signed)
Subjective:  This chart was scribed for Ellamae Siaobert Doolittle, MD by Richarda Overlieichard Holland, Medical scribe. This patient was seen in ROOM 9 and the patient's care was started 4:32 PM.   Patient ID: Omar Clark, male    DOB: January 09, 1981, 34 y.o.   MRN: [id number]   Chief Complaint  Patient presents with  . Medication Refill    oxycodone,gabapentin,prilosec   HPI HPI Comments: Omar Clark is a 34 y.o. male with a history of RAD, chronic urticaria and chronic pain who [facility] medication refill of his oxycodone, gabapentin and prilosec. Pt states he does not need as much of his prilosec medication because he takes less ibuprofen. He states that he no longer wakes up in the middle of the night to vomit. Pt reports no modifying or exacerbating factors at this time.   Pt states that he recently moved and is now using well water and not city water. He states that since this time his chronic urticaria presented on his back. Pt thinks that this water change may contribute to his urticaria. He states that 5mg  of prednisone typically relieves his urticaria but as soon as he stops the urticaria represents.   Pt states that he is going to receive an MRI soon for his back by Dr. Ophelia CharterYates.   Patient Active Problem List   Diagnosis Date Noted  . Chronic urticaria 04/14/2013  . Smoker 03/10/2013  . RAD (reactive airway disease) 03/10/2013  . Opiate addiction 11/29/2012  . Lumbar degenerative disc disease 11/29/2012  . Chronic pain syndrome 11/29/2012     Allergies  Allergen Reactions  . Aspirin Hives   Past Surgical History  Procedure Laterality Date  . Open reduction internal fixation (orif) hand      Review of Systems  Skin: Positive for rash.  All other systems reviewed and are negative.     Objective:   Physical Exam  Constitutional: He is oriented to person, place, and time. He appears well-developed and well-nourished. No distress.  HENT:  Head: Normocephalic and  atraumatic.  Eyes: Conjunctivae and EOM are normal. Pupils are equal, round, and reactive to light.  Neck: Neck supple.  Cardiovascular: Normal rate.   Pulmonary/Chest: Effort normal.  Neurological: He is alert and oriented to person, place, and time. No cranial nerve deficit.  Psychiatric: He has a normal mood and affect. His behavior is normal.  Nursing note and vitals reviewed.  Filed Vitals:   05/27/14 1613  BP: 132/82  Pulse: 116  Temp: 98.5 F (36.9 C)  TempSrc: Oral  Resp: 17  Height: 5\' 10"  (1.778 m)  Weight: 179 lb (81.194 kg)  SpO2: 97%      Assessment & Plan:   I have completed the patient encounter in its entirety as documented by the scribe, with editing by me where necessary. Robert P. Merla Richesoolittle, M.D.   Back pain, chronic - Plan: gabapentin (NEURONTIN) 300 MG capsule  Chronic pain syndrome  Lumbar degenerative disc disease  GERD  He has follow-up with dermatology to evaluate his chronic urticaria-referred by his PCP Meds ordered this encounter  Medications  . gabapentin (NEURONTIN) 300 MG capsule    Sig: Take 2 capsules (600 mg total) by mouth at bedtime.    Dispense:  60 capsule    Refill:  5  . OxyCODONE HCl ER 60 MG T12A    Sig: Take 60 mg by mouth 2 (two) times daily. For 30d after signed    Dispense:  60  each    Refill:  0  . OxyCODONE HCl ER 60 MG T12A    Sig: Take 60 mg by mouth 2 (two) times daily.    Dispense:  60 each    Refill:  0  . oxycodone (ROXICODONE) 30 MG immediate release tablet    Sig: Take 1.5 tablets (45 mg total) by mouth every 4 (four) hours as needed for pain. For 30d after signed    Dispense:  270 tablet    Refill:  0  . oxycodone (ROXICODONE) 30 MG immediate release tablet    Sig: Take 1.5 tablets (45 mg total) by mouth every 4 (four) hours as needed for pain.    Dispense:  270 tablet    Refill:  0  . omeprazole (PRILOSEC) 20 MG capsule    Sig: Take 1 capsule (20 mg total) by mouth daily.    Dispense:  30 capsule     Refill:  5   Awaiting orthopedic disposition with regard to what can be fixed and what will be needed to be treated causing chronic pain

## 2014-08-02 ENCOUNTER — Ambulatory Visit (INDEPENDENT_AMBULATORY_CARE_PROVIDER_SITE_OTHER): Payer: Self-pay | Admitting: Internal Medicine

## 2014-08-02 VITALS — BP 110/72 | HR 94 | Temp 98.6°F | Resp 20 | Ht 70.0 in | Wt 177.0 lb

## 2014-08-02 DIAGNOSIS — G894 Chronic pain syndrome: Secondary | ICD-10-CM

## 2014-08-02 DIAGNOSIS — M51369 Other intervertebral disc degeneration, lumbar region without mention of lumbar back pain or lower extremity pain: Secondary | ICD-10-CM

## 2014-08-02 DIAGNOSIS — M5136 Other intervertebral disc degeneration, lumbar region: Secondary | ICD-10-CM

## 2014-08-02 MED ORDER — OXYCODONE HCL ER 60 MG PO T12A: 60.0000 mg | EXTENDED_RELEASE_TABLET | Freq: Two times a day (BID) | ORAL | Status: DC

## 2014-08-02 MED ORDER — OXYCODONE HCL 30 MG PO TABS: 45.0000 mg | ORAL_TABLET | ORAL | Status: DC | PRN

## 2014-08-02 NOTE — Progress Notes (Signed)
   Subjective:    Patient ID: Omar Clark, male    DOB: 1981/02/05, 34 y.o.   MRN: [id number] This chart was scribed for Ellamae Siaobert Petrona Wyeth, MD by Jolene Provostobert Halas, Medical Scribe. This patient was seen in Room 10 and the patient's care was started a 7:51 PM.  Chief Complaint  Patient presents with  . Medication Refill    refills of oxycodone 30 mg and 60 mg and gabapentin    HPI HPI Comments: Omar Clark is a 34 y.o. male who [facility] for a medication [facility].  Patient Active Problem List   Diagnosis Date Noted  . Chronic urticaria 04/14/2013    Priority: Medium  . Chronic pain syndrome 11/29/2012    Priority: Medium  . Smoker 03/10/2013  . RAD (reactive airway disease) 03/10/2013  . Opiate addiction 11/29/2012  . Lumbar degenerative disc disease 11/29/2012    Stable at current doses  Pt is no longer taking sinequan because it had no effect on his hives. Pt states he has been doing well, and may be able to get medicaid soon.  Currently awaiting reeval of Back(MRI) to see if intervention possible so can end chronic pain meds-- Pt states the medicaid office told him that he needed to have his PCP sign off before he could get medicaid--Dr [name].--   Review of Systems    noncontr Objective:   Physical Exam  Constitutional: He is oriented to person, place, and time. He appears well-developed and well-nourished. No distress.  HENT:  Head: Normocephalic and atraumatic.  Eyes: EOM are normal. Pupils are equal, round, and reactive to light.  Neck: Neck supple.  Cardiovascular: Normal rate.   Pulmonary/Chest: Effort normal.  Musculoskeletal:  No chges  Neurological: He is alert and oriented to person, place, and time. No cranial nerve deficit.  Skin: Skin is warm and dry. He is not diaphoretic.  Psychiatric: He has a normal mood and affect. His behavior is normal.  Nursing note and vitals reviewed.      Assessment & Plan:  Chronic pain  syndrome  Lumbar degenerative disc disease  Meds ordered this encounter  Medications  . OxyCODONE HCl ER 60 MG T12A    Sig: Take 60 mg by mouth 2 (two) times daily. For 30d after signed    Dispense:  60 each    Refill:  0  . oxycodone (ROXICODONE) 30 MG immediate release tablet    Sig: Take 1.5 tablets (45 mg total) by mouth every 4 (four) hours as needed for pain. For 30d after signed    Dispense:  270 tablet    Refill:  0  . oxycodone (ROXICODONE) 30 MG immediate release tablet    Sig: Take 1.5 tablets (45 mg total) by mouth every 4 (four) hours as needed for pain.    Dispense:  270 tablet    Refill:  0  . OxyCODONE HCl ER 60 MG T12A    Sig: Take 60 mg by mouth 2 (two) times daily.    Dispense:  60 each    Refill:  0    Copy Note to Dr. [name].  I have completed the patient encounter in its entirety as documented by the scribe, with editing by me where necessary. Jerrid Forgette P. Merla Richesoolittle, M.D.

## 2014-08-24 ENCOUNTER — Ambulatory Visit (INDEPENDENT_AMBULATORY_CARE_PROVIDER_SITE_OTHER): Payer: Self-pay | Admitting: Family Medicine

## 2014-08-24 VITALS — BP 132/80 | HR 107 | Temp 98.1°F | Resp 16 | Ht 70.5 in | Wt 178.8 lb

## 2014-08-24 DIAGNOSIS — L500 Allergic urticaria: Secondary | ICD-10-CM

## 2014-08-24 DIAGNOSIS — F17200 Nicotine dependence, unspecified, uncomplicated: Secondary | ICD-10-CM

## 2014-08-24 DIAGNOSIS — Z720 Tobacco use: Secondary | ICD-10-CM

## 2014-08-24 DIAGNOSIS — K219 Gastro-esophageal reflux disease without esophagitis: Secondary | ICD-10-CM

## 2014-08-24 MED ORDER — PREDNISONE 20 MG PO TABS: ORAL_TABLET | ORAL | Status: DC

## 2014-08-24 MED ORDER — TRIAMCINOLONE ACETONIDE 0.1 % EX LOTN: 1.0000 "application " | TOPICAL_LOTION | Freq: Three times a day (TID) | CUTANEOUS | Status: DC

## 2014-08-24 NOTE — Patient Instructions (Addendum)
Take Zyrtec once or twice daily (cetirizine)  Take Zantac 150 mg twice daily (ranitidine) which can be obtained over-the-counter  Take prednisone 20 mg 3 pills daily for 3 days, then 2 daily for 2 days, then 1 daily for 3 days, then one half daily for 4 days  Return if not improving or if worse at any time  Use triamcinolone lotion on the bed places on your legs twice daily  Try holding off on the omeprazole (Prilosec) while on the Zantac

## 2014-08-24 NOTE — Progress Notes (Signed)
  Subjective:  Patient ID: Omar Clark, male    DOB: August 24, 1980  Age: 34 y.o. MRN: [id number]  34 year old man who has a history of chronic back pain and is on pain medication. Nothing is changed. He has a long history of urticaria over the course of his lifetime. He says he has been told he has 5 types of hives. He has broken out badly over the last couple days. His attention badly last night. He has extensive lesions on his legs and some elsewhere on his thighs and arms. He has some ecchymotic areas which developed in his places on the legs. He has been taking alert oriented histamines, combining Allegra and Zyrtec and Benadryl. He works as a Psychologist, occupational. He has a father with 3 children, does things outdoors, but has not been in the grass. Never wear shorts. He has a history of GERD and is on Prilosec.   Objective:   Scattered urticaria on upper body, extensive splotches on his legs with some excoriations and ecchymoses. He also has places on the top of his feet. No known soles of his feet. On the palms of the hands. Slightly coarse breath sounds but no wheezing  Assessment & Plan:   Assessment: Urticaria Tobacco use disorder Chronic pain syndrome gerd   Plan: Patient Instructions  Take Zyrtec once or twice daily (cetirizine)  Take Zantac 150 mg twice daily (ranitidine) which can be obtained over-the-counter  Take prednisone 20 mg 3 pills daily for 3 days, then 2 daily for 2 days, then 1 daily for 3 days, then one half daily for 4 days  Return if not improving or if worse at any time  Use triamcinolone lotion on the bed places on your legs twice daily  Try holding off on the omeprazole (Prilosec) while on the Zantac    HOPPER,DAVID, MD 08/24/2014

## 2014-09-02 ENCOUNTER — Ambulatory Visit (INDEPENDENT_AMBULATORY_CARE_PROVIDER_SITE_OTHER): Payer: Self-pay | Admitting: Internal Medicine

## 2014-09-02 VITALS — BP 138/78 | HR 104 | Temp 98.5°F | Resp 18 | Ht 70.5 in | Wt 170.8 lb

## 2014-09-02 DIAGNOSIS — R21 Rash and other nonspecific skin eruption: Secondary | ICD-10-CM

## 2014-09-02 DIAGNOSIS — G8929 Other chronic pain: Secondary | ICD-10-CM

## 2014-09-02 DIAGNOSIS — M549 Dorsalgia, unspecified: Secondary | ICD-10-CM

## 2014-09-02 MED ORDER — PERMETHRIN 5 % EX CREA: 1.0000 "application " | TOPICAL_CREAM | Freq: Once | CUTANEOUS | Status: DC

## 2014-09-02 MED ORDER — OXYCODONE HCL 30 MG PO TABS: 45.0000 mg | ORAL_TABLET | ORAL | Status: DC | PRN

## 2014-09-02 MED ORDER — OXYCODONE HCL ER 60 MG PO T12A: 60.0000 mg | EXTENDED_RELEASE_TABLET | Freq: Two times a day (BID) | ORAL | Status: DC

## 2014-09-02 NOTE — Progress Notes (Signed)
Subjective:  This chart was scribed for [name], MD by Stann Ore, Medical Scribe. This patient was seen in Room 3 and the patient's care was started at 9:43 AM.     Patient ID: Omar Clark, male    DOB: [birth year], 34 y.o.   MRN: [id number]  HPI Omar Clark is a 34 y.o. male who presents to [facility] for a hospitalization follow up for rash on his right lower leg. He also has a recent rash flare up on his right arm. He has taken some ivermectin but without resolve. The rash on his right lower leg has stopped itching. His son has similar rash on his head, body and buttocks. He thinks other family members have these little itchy bumps as well. After discharge from the hospital he self treated with ivermectin because he thinks he has scabies.  He worries about new bumps today.  He had to go to the hospital after waking up with a fever (Tmax 102) with a headache at the start of this week. He had taken ibuprofen but without relief. While he was at the hospital he passed out, was evaluated, and was found to be febrile with a temperature over 102, with a rash on his lower extremities, with low white blood cell count, low platelets, low sodium and he was admitted for treatment of suspected [facility] spotted fever.    He has a history of many strange rashes. He has a diagnosis of chronic urticaria which he needs intermittent treatment Your 08/24/2014 and treated with prednisone for an exacerbation of this and this was just a few days prior to his hospitalization He had a sore bump in his mouth but has resolved after he self treated this week with ivermectin. But now, he spits out some clear discharge.   He also has a bump on the right side of his face that started 3 weeks ago.   He had prescriptions for 30 days after signed for his chronic pain meds which he been misplaced and her due today   Patient Active Problem List   Diagnosis Date Noted  . Chronic urticaria 04/14/2013    . Smoker 03/10/2013  . RAD (reactive airway disease) 03/10/2013  . Opiate addiction 11/29/2012  . Lumbar degenerative disc disease 11/29/2012  . Chronic pain syndrome 11/29/2012    Current outpatient prescriptions:  .  doxycycline (VIBRA-TABS) 100 MG tablet, Take 100 mg by mouth 2 (two) times daily., Disp: , Rfl:  .  fexofenadine (ALLEGRA) 180 MG tablet, 1 tablet bid for chronic urticaria, Disp: 60 tablet, Rfl: 11 .  gabapentin (NEURONTIN) 300 MG capsule, Take 2 capsules (600 mg total) by mouth at bedtime., Disp: 60 capsule, Rfl: 5 .  omeprazole (PRILOSEC) 20 MG capsule, Take 1 capsule (20 mg total) by mouth daily., Disp: 30 capsule, Rfl: 5 .  oxycodone (ROXICODONE) 30 MG immediate release tablet, Take 1.5 tablets (45 mg total) by mouth every 4 (four) hours as needed for pain. For 30d after signed, Disp: 270 tablet, Rfl: 0 .  OxyCODONE HCl ER 60 MG T12A, Take 60 mg by mouth 2 (two) times daily. For 30d after signed, Disp: 60 each, Rfl: 0 .  hydrOXYzine (ATARAX/VISTARIL) 25 MG tablet, Take 1 tablet (25 mg total) by mouth every 6 (six) hours as needed for itching. (Patient not taking: Reported on 08/24/2014), Disp: 120 tablet, Rfl: 5 .  oxycodone (ROXICODONE) 30 MG immediate release tablet, Take 1.5 tablets (45 mg total) by mouth every 4 (  four) hours as needed for pain. (Patient not taking: Reported on 08/24/2014), Disp: 270 tablet, Rfl: 0 .  OxyCODONE HCl ER 60 MG T12A, Take 60 mg by mouth 2 (two) times daily. (Patient not taking: Reported on 08/24/2014), Disp: 60 each, Rfl: 0 .  predniSONE (DELTASONE) 20 MG tablet, Take 3 daily for 2 days, then 2 daily for 2 days, then 1 daily for 2 days, then one half daily for 4 days for allergy and hives (Patient not taking: Reported on 09/02/2014), Disp: 14 tablet, Rfl: 0 .  promethazine-dextromethorphan (PROMETHAZINE-DM) 6.25-15 MG/5ML syrup, Take 5-10 mLs by mouth 4 (four) times daily as needed for cough. (Patient not taking: Reported on 08/24/2014), Disp: 180  mL, Rfl: 0 .  triamcinolone lotion (KENALOG) 0.1 %, Apply 1 application topically 3 (three) times daily. (Patient not taking: Reported on 09/02/2014), Disp: 60 mL, Rfl: 1    Review of Systems  Constitutional: Positive for fever. Negative for fatigue.  HENT: Negative for congestion, rhinorrhea, sneezing and sore throat.   Respiratory: Negative for cough.   Gastrointestinal: Negative for nausea, vomiting, diarrhea and constipation.  Skin: Positive for rash (right lower leg, right arm).       Objective:   Physical Exam  Constitutional: He is oriented to person, place, and time. He appears well-developed and well-nourished. No distress.  HENT:  Head: Normocephalic and atraumatic.  Eyes: EOM are normal. Pupils are equal, round, and reactive to light.  Neck: Neck supple.  Cardiovascular: Normal rate.   Pulmonary/Chest: Effort normal. No respiratory distress.  Musculoskeletal: Normal range of motion.  Neurological: He is alert and oriented to person, place, and time.  Skin: Skin is warm and dry.  He has excoriated erythematous papular rash on his lower extremities with some linear tracking There are several crusty lesions over his feet in particular and over the anterior shins. No evidence of cellulitis, no vesiculation. No hives. No purpura or petechiae. He has small erythematous macule papular lesions on his arms that may be related to hospitalization IVs. He has a new bump on the right side of his temple which is been there for 3 weeks which appears to me to be pearly and firm.  Psychiatric: He has a normal mood and affect. His behavior is normal.  Nursing note and vitals reviewed.         Assessment & Plan:  Problem #1 recent hospitalization for febrile illness with lab parameters [facility]. 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 Carleena Mires P. Merla Richesoolittle, M.D.

## 2014-09-14 ENCOUNTER — Ambulatory Visit: Payer: Self-pay | Admitting: Internal Medicine

## 2014-09-15 ENCOUNTER — Telehealth: Payer: Self-pay | Admitting: Internal Medicine

## 2014-09-15 ENCOUNTER — Telehealth: Payer: Self-pay

## 2014-09-15 NOTE — Telephone Encounter (Signed)
Patient called our office today and left this message Patient missed his appointment on 09/14/2014 with Dr. Merla Richesoolittle. He states that he has Medicaid and our office does not take that insurance. He states that his PCP is going to contact our office and request all his records be sent to them.   Omar Clark"  Medical records will wait for requests from PCP office then process accordingly.

## 2014-09-15 NOTE — Telephone Encounter (Signed)
Patient's wife Sheralyn Boatmanoni left voicemail on Wednesday at 3:30pm stating they need records sent to patient's PCP, Dr. [name] in [place]. She did not specify which records to send. Her call back number is [phone].

## 2014-09-15 NOTE — Telephone Encounter (Addendum)
Patient missed his appointment on 09/14/2014 with Dr. Merla Richesoolittle. He states that he has Medicaid and our office does not take that insurance. He states that his PCP is going to contact our office and request all his records be sent to them.   Leavy Cella-Jasmine D

## 2014-09-16 NOTE — Telephone Encounter (Signed)
No calls or requests from PCP yet.

## 2014-09-20 NOTE — Telephone Encounter (Signed)
No ROI as of 09/20/14

## 2014-09-23 NOTE — Telephone Encounter (Signed)
No release form yet.

## 2014-10-01 ENCOUNTER — Ambulatory Visit (INDEPENDENT_AMBULATORY_CARE_PROVIDER_SITE_OTHER): Payer: Self-pay | Admitting: Internal Medicine

## 2014-10-01 VITALS — BP 108/70 | HR 80 | Temp 98.4°F | Resp 16 | Ht 70.5 in | Wt 170.4 lb

## 2014-10-01 DIAGNOSIS — M5116 Intervertebral disc disorders with radiculopathy, lumbar region: Secondary | ICD-10-CM

## 2014-10-01 DIAGNOSIS — Z720 Tobacco use: Secondary | ICD-10-CM

## 2014-10-01 DIAGNOSIS — M5136 Other intervertebral disc degeneration, lumbar region: Secondary | ICD-10-CM

## 2014-10-01 DIAGNOSIS — G8929 Other chronic pain: Secondary | ICD-10-CM

## 2014-10-01 DIAGNOSIS — M549 Dorsalgia, unspecified: Secondary | ICD-10-CM

## 2014-10-01 DIAGNOSIS — F17200 Nicotine dependence, unspecified, uncomplicated: Secondary | ICD-10-CM

## 2014-10-01 MED ORDER — OXYCODONE HCL ER 60 MG PO T12A: 60.0000 mg | EXTENDED_RELEASE_TABLET | Freq: Two times a day (BID) | ORAL | Status: DC

## 2014-10-01 MED ORDER — OXYCODONE HCL 30 MG PO TABS: 45.0000 mg | ORAL_TABLET | ORAL | Status: DC | PRN

## 2014-10-01 NOTE — Progress Notes (Signed)
Subjective:    Patient ID: Omar Clark, male    DOB: 08-Nov-1980, 34 y.o.   MRN: [id number] This chart was scribed for Omar Sia, MD by Jolene Provost, Medical Scribe. This patient was seen in Room 11 and the patient's care was started a 12:01 PM.  Chief Complaint  Patient presents with  . Medication Refill    oxycodone( Roxicodone) and oxycodone HC  . bumps on left had    x 2 days   Note past records indicate neuropathic changes in the lower extremities per Dr.Dawka in 2014. HPI HPI Comments: Omar Clark is a 34 y.o. male who presents to [facility] needing a medication refill.  Pt  complaining of bumps on his left hand for the last two days and continued rash on his forearms although his other rash treated with Elimite at last visit has resolved. He has had no sequelae of [place] spotted fever and is waiting on follow-up lab work.  Dr. Jeannetta Nap his PCP has referred him to [place] for continued care of his chronic back pain. He has been refused MRI and referrals to ortho because of this insurance but perhaps now we have permission. His current symptoms are controlled for the most part by his chronic pain regimen. Review of the [place] shows no other prescribers besides me.   Review of Systems  Constitutional: Negative for fever and chills.  Musculoskeletal: Positive for back pain.  Skin: Positive for color change and rash.       Objective:   Physical Exam  Constitutional: He is oriented to person, place, and time. He appears well-developed and well-nourished. No distress.  HENT:  Head: Normocephalic and atraumatic.  Eyes: Pupils are equal, round, and reactive to light.  Neck: Neck supple.  Cardiovascular: Normal rate.   Pulmonary/Chest: Effort normal. No respiratory distress.  Musculoskeletal: Normal range of motion.  Neurological: He is alert and oriented to person, place, and time. Coordination normal.  Skin: Skin is warm and dry. He is not  diaphoretic.  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 Continued excoriations on forearms. No new signs of scabetic disease.  Psychiatric: He has a normal mood and affect. His behavior is normal.  Nursing note and vitals reviewed. BP 108/70 mmHg  Pulse 80  Temp(Src) 98.4 F (36.9 C) (Oral)  Resp 16  Ht 5' 10.5" (1.791 m)  Wt 170 lb 6.4 oz (77.293 kg)  BMI 24.10 kg/m2  SpO2 98%      Assessment & Plan:  Back pain, chronic - Plan: MR Lumbar Spine Wo Contrast  Lumbar degenerative disc disease  Lumbar disc disease with radiculopathy - Plan: MR Lumbar Spine Wo Contrast  Smoker--he'll start patches  Meds ordered this encounter  Medications  . oxycodone (ROXICODONE) 30 MG immediate release tablet    Sig: Take 1.5 tablets (45 mg total) by mouth every 4 (four) hours as needed for pain.    Dispense:  270 tablet    Refill:  0    For 30d after signed  . OxyCODONE HCl ER 60 MG T12A    Sig: Take 60 mg by mouth 2 (two) times daily.    Dispense:  60 each    Refill:  0    For 30d after signed  . OxyCODONE HCl ER 60 MG T12A    Sig: Take 60 mg by mouth 2 (two) times daily. For 30d after signed    Dispense:  60 each    Refill:  0  .  oxycodone (ROXICODONE) 30 MG immediate release tablet    Sig: Take 1.5 tablets (45 mg total) by mouth every 4 (four) hours as needed for pain.    Dispense:  270 tablet    Refill:  0   He will be scheduled for an MRI again to see if insurance will cover this now that we have been referred to by his PCP Dr. Jeannetta Nap After the MRI scheduled him for orthopedic consultation in the hopes of this deciding whether he needs chronic pain therapy or he will respond to physiatry and or surgery.  I have completed the patient encounter in its entirety as documented by the scribe, with editing by me where necessary. Michalina Calbert P. Merla Riches, M.D.

## 2014-10-14 ENCOUNTER — Ambulatory Visit (INDEPENDENT_AMBULATORY_CARE_PROVIDER_SITE_OTHER): Payer: Self-pay | Admitting: Internal Medicine

## 2014-10-14 VITALS — BP 128/82 | HR 75 | Temp 98.2°F | Resp 14 | Ht 70.5 in | Wt 171.0 lb

## 2014-10-14 DIAGNOSIS — R5383 Other fatigue: Secondary | ICD-10-CM

## 2014-10-14 DIAGNOSIS — R945 Abnormal results of liver function studies: Secondary | ICD-10-CM

## 2014-10-14 DIAGNOSIS — R7989 Other specified abnormal findings of blood chemistry: Secondary | ICD-10-CM

## 2014-10-14 LAB — COMPREHENSIVE METABOLIC PANEL
ALBUMIN: 4.4 g/dL (ref 3.6–5.1)
ALT: 15 U/L (ref 9–46)
AST: 10 U/L (ref 10–40)
Alkaline Phosphatase: 79 U/L (ref 40–115)
BUN: 14 mg/dL (ref 7–25)
CALCIUM: 9.5 mg/dL (ref 8.6–10.3)
CHLORIDE: 101 mmol/L (ref 98–110)
CO2: 27 mmol/L (ref 20–31)
Creat: 0.78 mg/dL (ref 0.60–1.35)
Glucose, Bld: 96 mg/dL (ref 65–99)
POTASSIUM: 4.3 mmol/L (ref 3.5–5.3)
Sodium: 139 mmol/L (ref 135–146)
Total Bilirubin: 0.3 mg/dL (ref 0.2–1.2)
Total Protein: 7 g/dL (ref 6.1–8.1)

## 2014-10-14 LAB — POCT CBC
GRANULOCYTE PERCENT: 58.5 % (ref 37–80)
HEMATOCRIT: 41.4 % — AB (ref 43.5–53.7)
Hemoglobin: 13.1 g/dL — AB (ref 14.1–18.1)
Lymph, poc: 3.9 — AB (ref 0.6–3.4)
MCH, POC: 28 pg (ref 27–31.2)
MCHC: 31.6 g/dL — AB (ref 31.8–35.4)
MCV: 88.6 fL (ref 80–97)
MID (CBC): 0.3 (ref 0–0.9)
MPV: 7.3 fL (ref 0–99.8)
PLATELET COUNT, POC: 274 10*3/uL (ref 142–424)
POC GRANULOCYTE: 5.9 (ref 2–6.9)
POC LYMPH %: 38.5 % (ref 10–50)
POC MID %: 3 %M (ref 0–12)
RBC: 4.68 M/uL — AB (ref 4.69–6.13)
RDW, POC: 14.6 %
WBC: 10.1 10*3/uL (ref 4.6–10.2)

## 2014-10-14 LAB — TSH: TSH: 1.798 u[IU]/mL (ref 0.350–4.500)

## 2014-10-14 MED ORDER — OXYCODONE HCL 20 MG PO TABS: 40.0000 mg | ORAL_TABLET | ORAL | Status: DC | PRN

## 2014-10-14 MED ORDER — IVERMECTIN 3 MG PO TABS: ORAL_TABLET | ORAL | Status: DC

## 2014-10-14 NOTE — Progress Notes (Addendum)
Subjective:    Patient ID: Omar Clark, male    DOB: 1980-07-29, 34 y.o.   MRN: [id number] This chart was scribed for Omar Sia, MD by Jolene Provost, Medical Scribe. This patient was seen in Room 14 and the patient's care was started a 10:18 AM.  Chief Complaint  Patient presents with  . Rash    HPI HPI Comments: [name] is a 34 y.o. male who presents to [facility] complaining of a continued rash. Pt states he bought a microscope and took photos of the individual ulcerations. He took photos before and after he put hydrogen peroxide on the rash. Pt endorses continued associated fatigue. He has also lost a little weight, but thinks that he has lost weight because he has stopped taking prednisone. Pt states he would like to get some labs while he is here. Needs f/u on recent hosp. Pt's hives/chr urticaria went away after taking ivermectin from vet sources when he was sure his rash was scabies. Derm eval said no diagnosis so he was upset and brings in serial pictures of multiple lesions with yellow crusting. C/o fatigue daily. No sleepdisrup.   hospitalized at [facility] 08/30/14 with a suspected tick bourne illness he was noted to have leukopenia, thrombocytopenia, mild anemia, hyponutremia, hypocalcemia, abnormal liver fx studies and elevated C reactive protein. HIV, RPR, and gen probe were negative. Presumptive diagnosis, [place] Spotted Fever.   Pain rx---brings in oxy 30 ottle from 7/30 with desenigrated pills from falling in water--these are taken and destroyed  Review of Systems  Constitutional: Positive for fatigue. Negative for fever and chills.  Skin: Positive for color change and rash.       Objective:   Physical Exam  Constitutional: He is oriented to person, place, and time. He appears well-developed and well-nourished. No distress.  HENT:  Head: Normocephalic and atraumatic.  Eyes: Pupils are equal, round, and reactive to light.  Neck: Neck supple.    Cardiovascular: Normal rate.   Pulmonary/Chest: Effort normal. No respiratory distress.  Musculoskeletal: Normal range of motion.  Neurological: He is alert and oriented to person, place, and time. Coordination normal.  Skin: Skin is warm and dry. He is not diaphoretic.  No active vesic//no new linear papules/no red papules/several areas of healed papules and few scabs/excoriations  Psychiatric: He has a normal mood and affect. His behavior is normal.  Nursing note and vitals reviewed.   Filed Vitals:   10/14/14 0931  BP: 128/82  Pulse: 75  Temp: 98.2 F (36.8 C)  TempSrc: Oral  Resp: 14  Height: 5' 10.5" (1.791 m)  Weight: 171 lb (77.565 kg)  SpO2: 98%   Results for orders placed or performed in visit on 10/14/14  POCT CBC  Result Value Ref Range   WBC 10.1 4.6 - 10.2 K/uL   Lymph, poc 3.9 (A) 0.6 - 3.4   POC LYMPH PERCENT 38.5 10 - 50 %L   MID (cbc) 0.3 0 - 0.9   POC MID % 3.0 0 - 12 %M   POC Granulocyte 5.9 2 - 6.9   Granulocyte percent 58.5 37 - 80 %G   RBC 4.68 (A) 4.69 - 6.13 M/uL   Hemoglobin 13.1 (A) 14.1 - 18.1 g/dL   HCT, POC 40.9 (A) 81.1 - 53.7 %   MCV 88.6 80 - 97 fL   MCH, POC 28.0 27 - 31.2 pg   MCHC 31.6 (A) 31.8 - 35.4 g/dL   RDW, POC 91.4 %  Platelet Count, POC 274 142 - 424 K/uL   MPV 7.3 0 - 99.8 fL       Assessment & Plan:  Other fatigue - Plan: POCT CBC, TSH  Abnormal liver function test - Plan: Comprehensive metabolic panel other abnor as noted  Chr pain needing med repl---done with diff strength til refill time  Chronic urticaria resolved-recommended 1 more therapeutic dose of ivermectin/unclear reasons beyond successful outcome  Current Rash likely secondary to excoriations-improving  Meds ordered this encounter  Medications  . ivermectin (STROMECTOL) 3 MG TABS tablet    Sig: Take single dose    Dispense:  4 tablet    Refill:  0  . Oxycodone HCl 20 MG TABS    Sig: Take 2 tablets (40 mg total) by mouth every 4 (four) hours as  needed. For 8/12 to 8/30    Dispense:  220 tablet    Refill:  0    I have completed the patient encounter in its entirety as documented by the scribe, with editing by me where necessary. Deziyah Arvin P. Merla Riches, M.D.

## 2014-10-23 ENCOUNTER — Encounter: Payer: Self-pay | Admitting: Family Medicine

## 2014-12-05 ENCOUNTER — Ambulatory Visit (INDEPENDENT_AMBULATORY_CARE_PROVIDER_SITE_OTHER): Payer: Self-pay | Admitting: Internal Medicine

## 2014-12-05 VITALS — BP 130/82 | HR 98 | Temp 98.3°F | Resp 16 | Ht 70.5 in | Wt 163.6 lb

## 2014-12-05 DIAGNOSIS — M5136 Other intervertebral disc degeneration, lumbar region: Secondary | ICD-10-CM

## 2014-12-05 DIAGNOSIS — G894 Chronic pain syndrome: Secondary | ICD-10-CM

## 2014-12-05 MED ORDER — OMEPRAZOLE 20 MG PO CPDR: 20.0000 mg | DELAYED_RELEASE_CAPSULE | Freq: Every day | ORAL | Status: DC

## 2014-12-05 MED ORDER — OXYCODONE HCL 30 MG PO TABS: 45.0000 mg | ORAL_TABLET | ORAL | Status: DC | PRN

## 2014-12-05 MED ORDER — OXYCODONE HCL ER 60 MG PO T12A: 60.0000 mg | EXTENDED_RELEASE_TABLET | Freq: Two times a day (BID) | ORAL | Status: DC

## 2014-12-05 NOTE — Progress Notes (Signed)
Subjective:    Patient ID: Omar Clark, male    DOB: 1980/04/11, 34 y.o.   MRN: [id number] This chart was scribed for [name], MD by Jolene Provost, Medical Scribe. This patient was seen in Room 3 and the patient's care was started a 5:34 PM.  Chief Complaint  Patient presents with  . Medication Refill    ocycodone,prilosec    HPI HPI Comments: Omar Clark is a 34 y.o. male who presents to [facility] for medication refill. No long history of chronic pain secondary to back disease managed by high-dose narcotics. We are waiting approval by his primary care provider Dr. Jeannetta Nap do an MRI and assess his current status in the hopes that he may have a surgical option. This process is been delayed by the current cardiac problems of his youngest son who has been involved in diagnostic procedures.  He continues with skin lesions like at his last visit but now recognizes that there is no cause by parasites.  Review of Systems  Constitutional: Negative for fever and chills.  Gastrointestinal: Negative for abdominal pain, diarrhea and constipation.  Musculoskeletal: Positive for back pain.  Skin: Positive for color change, rash and wound.       Objective:   Physical Exam  Constitutional: He is oriented to person, place, and time. He appears well-developed and well-nourished. No distress.  HENT:  Head: Normocephalic and atraumatic.  Eyes: Pupils are equal, round, and reactive to light.  Neck: Neck supple.  Cardiovascular: Normal rate.   Pulmonary/Chest: Effort normal. No respiratory distress.  Musculoskeletal: Normal range of motion.  Neurological: He is alert and oriented to person, place, and time. Coordination normal.  Skin: Skin is warm and dry. He is not diaphoretic.  Psychiatric: He has a normal mood and affect. His behavior is normal.  Nursing note and vitals reviewed.   Filed Vitals:   12/05/14 1728  BP: 130/82  Pulse: 98  Temp: 98.3 F (36.8 C)  TempSrc: Oral    Resp: 16  Height: 5' 10.5" (1.791 m)  Weight: 163 lb 9.6 oz (74.208 kg)  SpO2: 98%       Assessment & Plan:   By signing my name below, I, Javier Docker, attest that this documentation has been prepared under the direction and in the presence of [name], MD. Electronically Signed: Javier Docker, ER Scribe. 12/05/2014. 5:46 PM. I have completed the patient encounter in its entirety as documented by the scribe, with editing by me where necessary. Robert P. Merla Riches, M.D.  Chronic pain syndrome  Lumbar degenerative disc disease  Meds ordered this encounter  Medications  . omeprazole (PRILOSEC) 20 MG capsule    Sig: Take 1 capsule (20 mg total) by mouth daily.    Dispense:  30 capsule    Refill:  5  . OxyCODONE HCl ER 60 MG T12A    Sig: Take 60 mg by mouth 2 (two) times daily.    Dispense:  60 each    Refill:  0  . oxycodone (ROXICODONE) 30 MG immediate release tablet    Sig: Take 1.5 tablets (45 mg total) by mouth every 4 (four) hours as needed for pain.    Dispense:  270 tablet    Refill:  0  . OxyCODONE HCl ER 60 MG T12A    Sig: Take 60 mg by mouth 2 (two) times daily.    Dispense:  60 each    Refill:  0    For 30d after  signed  . oxycodone (ROXICODONE) 30 MG immediate release tablet    Sig: Take 1.5 tablets (45 mg total) by mouth every 4 (four) hours as needed for pain.    Dispense:  270 tablet    Refill:  0    For 30d after signed   2mos Arrange mri

## 2014-12-13 ENCOUNTER — Encounter (HOSPITAL_COMMUNITY): Payer: Self-pay | Admitting: Emergency Medicine

## 2014-12-13 ENCOUNTER — Emergency Department (HOSPITAL_COMMUNITY): Payer: Medicaid Other

## 2014-12-13 ENCOUNTER — Emergency Department (HOSPITAL_COMMUNITY)
Admission: EM | Admit: 2014-12-13 | Discharge: 2014-12-13 | Disposition: A | Discharge status: Home / Self Care | Payer: Medicaid Other | Attending: Emergency Medicine | Admitting: Emergency Medicine

## 2014-12-13 DIAGNOSIS — F329 Major depressive disorder, single episode, unspecified: Secondary | ICD-10-CM | POA: Insufficient documentation

## 2014-12-13 DIAGNOSIS — R51 Headache: Secondary | ICD-10-CM | POA: Diagnosis not present

## 2014-12-13 DIAGNOSIS — R55 Syncope and collapse: Secondary | ICD-10-CM | POA: Diagnosis not present

## 2014-12-13 DIAGNOSIS — Z720 Tobacco use: Secondary | ICD-10-CM | POA: Insufficient documentation

## 2014-12-13 DIAGNOSIS — G8929 Other chronic pain: Secondary | ICD-10-CM | POA: Insufficient documentation

## 2014-12-13 DIAGNOSIS — Z79899 Other long term (current) drug therapy: Secondary | ICD-10-CM | POA: Insufficient documentation

## 2014-12-13 DIAGNOSIS — R519 Headache, unspecified: Secondary | ICD-10-CM

## 2014-12-13 LAB — CBC WITH DIFFERENTIAL/PLATELET
Basophils Absolute: 0 10*3/uL (ref 0.0–0.1)
Basophils Relative: 0 %
EOS ABS: 0.6 10*3/uL (ref 0.0–0.7)
EOS PCT: 5 %
HCT: 45.6 % (ref 39.0–52.0)
HEMOGLOBIN: 15.9 g/dL (ref 13.0–17.0)
LYMPHS ABS: 3.8 10*3/uL (ref 0.7–4.0)
Lymphocytes Relative: 31 %
MCH: 30.6 pg (ref 26.0–34.0)
MCHC: 34.9 g/dL (ref 30.0–36.0)
MCV: 87.9 fL (ref 78.0–100.0)
MONOS PCT: 4 %
Monocytes Absolute: 0.5 10*3/uL (ref 0.1–1.0)
Neutro Abs: 7.1 10*3/uL (ref 1.7–7.7)
Neutrophils Relative %: 60 %
PLATELETS: 304 10*3/uL (ref 150–400)
RBC: 5.19 MIL/uL (ref 4.22–5.81)
RDW: 14.4 % (ref 11.5–15.5)
WBC: 12 10*3/uL — ABNORMAL HIGH (ref 4.0–10.5)

## 2014-12-13 LAB — BASIC METABOLIC PANEL
ANION GAP: 11 (ref 5–15)
BUN: 10 mg/dL (ref 6–20)
CO2: 25 mmol/L (ref 22–32)
Calcium: 9.9 mg/dL (ref 8.9–10.3)
Chloride: 101 mmol/L (ref 101–111)
Creatinine, Ser: 0.92 mg/dL (ref 0.61–1.24)
GFR calc Af Amer: >60 mL/min (ref >60)
GLUCOSE: 98 mg/dL (ref 65–99)
POTASSIUM: 3.7 mmol/L (ref 3.5–5.1)
Sodium: 137 mmol/L (ref 135–145)

## 2014-12-13 MED ORDER — DEXAMETHASONE 0.5 MG PO TABS
1.0000 mg | ORAL_TABLET | Freq: Once | ORAL | Status: AC
  Administered 2014-12-13: 1 mg via ORAL
  Filled 2014-12-13 (×3): qty 2

## 2014-12-13 MED ORDER — DIPHENHYDRAMINE HCL 25 MG PO CAPS
50.0000 mg | ORAL_CAPSULE | Freq: Once | ORAL | Status: AC
  Administered 2014-12-13: 50 mg via ORAL
  Filled 2014-12-13: qty 2

## 2014-12-13 MED ORDER — METOCLOPRAMIDE HCL 10 MG PO TABS
5.0000 mg | ORAL_TABLET | Freq: Once | ORAL | Status: AC
  Administered 2014-12-13: 5 mg via ORAL
  Filled 2014-12-13: qty 1

## 2014-12-13 MED ORDER — ACETAMINOPHEN 325 MG PO TABS
650.0000 mg | ORAL_TABLET | Freq: Once | ORAL | Status: AC
  Administered 2014-12-13: 650 mg via ORAL
  Filled 2014-12-13: qty 2

## 2014-12-13 MED ORDER — SODIUM CHLORIDE 0.9 % IV BOLUS (SEPSIS): 1000.0000 mL | Freq: Once | INTRAVENOUS | Status: DC

## 2014-12-13 NOTE — ED Notes (Signed)
Called pharmacy about decadron, still unable to location medication. Will send again.

## 2014-12-13 NOTE — ED Notes (Signed)
Called pharmacy about decadron, unable to locate it in tube station. Will send again.

## 2014-12-13 NOTE — ED Notes (Signed)
Pt returned to room, given earrings and discharge paperwork. Pt willing to sign, unable to at this time, patient room occupied by another patient.

## 2014-12-13 NOTE — ED Notes (Signed)
Pt returned from CT °

## 2014-12-13 NOTE — ED Provider Notes (Signed)
CSN: [id number]     Arrival date & time 12/13/14  1055 History   First MD Initiated Contact with Patient 12/13/14 1112     Chief Complaint  Patient presents with  . Headache     (Consider location/radiation/quality/duration/timing/severity/associated sxs/prior Treatment) HPI   Omar Clark is a 34 y.o. male with PMH significant for chronic back pain and depression who presents with history of gradual, worsening, throbbing 3 week headache.  No history of trauma/injury.  Endorses 2 syncopal episodes lasting a couple of seconds, one upon standing and the second while driving.  Before the syncopal episodes he states he feels dizzy and lightheaded.  Endorses photophobia.  Denies N/V/D, CP, SOB, fevers, chills, numbness/tingling, weakness, or visual disturbances.   Past Medical History  Diagnosis Date  . Chronic back pain   . Depression    Past Surgical History  Procedure Laterality Date  . Open reduction internal fixation (orif) hand     Family History  Problem Relation Age of Onset  . Cancer Mother   . Cancer Maternal Grandmother   . Cancer Maternal Grandfather   . Cancer Paternal Grandmother   . Alzheimer's disease Paternal Grandfather    Social History  Substance Use Topics  . Smoking status: Current Every Day Smoker -- 1.00 packs/day for 20 years    Types: Cigarettes  . Smokeless tobacco: Never Used  . Alcohol Use: No    Review of Systems  All other systems negative unless otherwise stated in HPI   Allergies  Aspirin  Home Medications   Prior to Admission medications   Medication Sig Start Date End Date Taking? Authorizing Provider  gabapentin (NEURONTIN) 300 MG capsule Take 2 capsules (600 mg total) by mouth at bedtime. 05/27/14   [name], MD  omeprazole (PRILOSEC) 20 MG capsule Take 1 capsule (20 mg total) by mouth daily. 12/05/14   [name], MD  oxycodone (ROXICODONE) 30 MG immediate release tablet Take 1.5 tablets (45 mg total) by  mouth every 4 (four) hours as needed for pain. 12/05/14   [name], MD  oxycodone (ROXICODONE) 30 MG immediate release tablet Take 1.5 tablets (45 mg total) by mouth every 4 (four) hours as needed for pain. 12/05/14   [name], MD  OxyCODONE HCl ER 60 MG T12A Take 60 mg by mouth 2 (two) times daily. 12/05/14   [name], MD  OxyCODONE HCl ER 60 MG T12A Take 60 mg by mouth 2 (two) times daily. 12/05/14   [name], MD   BP 127/91 mmHg  Pulse 82  Temp(Src) 98.9 F (37.2 C) (Oral)  Resp 16  SpO2 100% Physical Exam  Constitutional: He is oriented to person, place, and time. He appears well-developed and well-nourished.  HENT:  Head: Normocephalic and atraumatic.  Mouth/Throat: Oropharynx is clear and moist.  Eyes: Pupils are equal, round, and reactive to light.  Neck: Normal range of motion. Neck supple.  Cardiovascular: Normal rate and regular rhythm.   No murmur heard. Pulmonary/Chest: Effort normal and breath sounds normal. No respiratory distress. He has no wheezes. He has no rales.  Abdominal: Soft. Bowel sounds are normal. He exhibits no distension. There is no tenderness.  Musculoskeletal: Normal range of motion.  Lymphadenopathy:    He has no cervical adenopathy.  Neurological: He is alert and oriented to person, place, and time.  Mental Status:   AOx3 Cranial Nerves:  Grossly intact.  No facial droop.  No focal deficits. Motor:  Good muscle bulk and tone  Strength 5/5 bilaterally in upper and lower extremities   Cerebellar--RAMs, finger to nose intact  Casual and tandem gait normal without ataxia  No pronator drift Sensory:  Intact in upper and lower extremities      Skin: Skin is warm and dry.  Psychiatric: He has a normal mood and affect. His behavior is normal.    ED Course  Procedures (including critical care time) Labs Review Labs Reviewed  BASIC METABOLIC PANEL  CBC WITH DIFFERENTIAL/PLATELET    Imaging Review No  results found. I have personally reviewed and evaluated these images and lab results as part of my medical decision-making.   EKG Interpretation   Date/Time:  Tuesday December 13 2014 12:29:30 EDT Ventricular Rate:  72 PR Interval:  225 QRS Duration: 87 QT Interval:  360 QTC Calculation: 394 R Axis:   71 Text Interpretation:  Sinus rhythm Prolonged PR interval No old tracing to  compare Confirmed by Mirian Mo [phone]) on 12/13/2014 12:56:02 PM      MDM   Final diagnoses:  None   Patient presents with 3 week history of gradually worsening headache with 2 syncopal episodes.  EKG, BMP, CBC, orthostatics pending.  On exam, no focal neurological deficits.  Heart RRR, lungs CTAB.  Concern for cardiac vs neurogenic etiology for syncopal episodes. EKG shows sinus rhythm with prolonged PR interval.  Patient is not orthostatic.  Will obtain head CT. Migraine cocktail for pain. -BMP and CBC unremarkable. -CT head shows patchy ethmoid sinus disease b/l.  No intracranial mass, hemorrhage, lesions, or infarct.   Patient stable for discharge.  Discussed return precautions.  FOllow up with neurology and cardiology.  Patient agrees with the above plan.  Case has been discussed with Dr. Littie Deeds who agrees with the above plan for discharge.   Cheri Fowler, PA-C 12/13/14 1624  Mirian Mo, MD 12/17/14 [phone]

## 2014-12-13 NOTE — ED Notes (Signed)
Pt unable to be located at this time. Found patients metal earrings and gave them to security for lost and found.

## 2014-12-13 NOTE — ED Notes (Signed)
Pt attempting to leave, states he was told he was getting discharge paperwork. Pt instructed to return to room and would bring paperwork when it was printed. Pt states "okay, i'll be right back, my wife will be in the room" and walks out the door and does not return. Patients wife leaves the room as well, unable to locate patient at this time.

## 2014-12-13 NOTE — ED Notes (Signed)
Pt sts HA x 2 weeks in the back of his head with some photophobia

## 2014-12-13 NOTE — Discharge Instructions (Signed)
Migraine Headache A migraine headache is very bad, throbbing pain on one or both sides of your head. Talk to your doctor about what things may bring on (trigger) your migraine headaches. HOME CARE  Only take medicines as told by your doctor.  Lie down in a dark, quiet room when you have a migraine.  Keep a journal to find out if certain things bring on migraine headaches. For example, write down:  What you eat and drink.  How much sleep you get.  Any change to your diet or medicines.  Lessen how much alcohol you drink.  Quit smoking if you smoke.  Get enough sleep.  Lessen any stress in your life.  Keep lights dim if bright lights bother you or make your migraines worse. GET HELP RIGHT AWAY IF:   Your migraine becomes really bad.  You have a fever.  You have a stiff neck.  You have trouble seeing.  Your muscles are weak, or you lose muscle control.  You lose your balance or have trouble walking.  You feel like you will pass out (faint), or you pass out.  You have really bad symptoms that are different than your first symptoms. MAKE SURE YOU:   Understand these instructions.  Will watch your condition.  Will get help right away if you are not doing well or get worse.   This information is not intended to replace advice given to you by your health care provider. Make sure you discuss any questions you have with your health care provider.   Document Released: 11/28/2007 Document Revised: 05/13/2011 Document Reviewed: 10/26/2012 Elsevier Interactive Patient Education 2016 [employer].  Syncope Syncope is a medical term for fainting or passing out. This means you lose consciousness and drop to the ground. People are generally unconscious for less than 5 minutes. You may have some muscle twitches for up to 15 seconds before waking up and returning to normal. Syncope occurs more often in older adults, but it can happen to anyone. While most causes of syncope are not  dangerous, syncope can be a sign of a serious medical problem. It is important to seek medical care.  CAUSES  Syncope is caused by a sudden drop in blood flow to the brain. The specific cause is often not determined. Factors that can bring on syncope include:  Taking medicines that lower blood pressure.  Sudden changes in posture, such as standing up quickly.  Taking more medicine than prescribed.  Standing in one place for too long.  Seizure disorders.  Dehydration and excessive exposure to heat.  Low blood sugar (hypoglycemia).  Straining to have a bowel movement.  Heart disease, irregular heartbeat, or other circulatory problems.  Fear, emotional distress, seeing blood, or severe pain. SYMPTOMS  Right before fainting, you may:  Feel dizzy or light-headed.  Feel nauseous.  See all white or all black in your field of vision.  Have cold, clammy skin. DIAGNOSIS  Your health care provider will ask about your symptoms, perform a physical exam, and perform an electrocardiogram (ECG) to record the electrical activity of your heart. Your health care provider may also perform other heart or blood tests to determine the cause of your syncope which may include:  Transthoracic echocardiogram (TTE). During echocardiography, sound waves are used to evaluate how blood flows through your heart.  Transesophageal echocardiogram (TEE).  Cardiac monitoring. This allows your health care provider to monitor your heart rate and rhythm in real time.  Holter monitor. This is a  portable device that records your heartbeat and can help diagnose heart arrhythmias. It allows your health care provider to track your heart activity for several days, if needed.  Stress tests by exercise or by giving medicine that makes the heart beat faster. TREATMENT  In most cases, no treatment is needed. Depending on the cause of your syncope, your health care provider may recommend changing or stopping some of your  medicines. HOME CARE INSTRUCTIONS  Have someone stay with you until you feel stable.  Do not drive, use machinery, or play sports until your health care provider says it is okay.  Keep all follow-up appointments as directed by your health care provider.  Lie down right away if you start feeling like you might faint. Breathe deeply and steadily. Wait until all the symptoms have passed.  Drink enough fluids to keep your urine clear or pale yellow.  If you are taking blood pressure or heart medicine, get up slowly and take several minutes to sit and then stand. This can reduce dizziness. SEEK IMMEDIATE MEDICAL CARE IF:   You have a severe headache.  You have unusual pain in the chest, abdomen, or back.  You are bleeding from your mouth or rectum, or you have black or tarry stool.  You have an irregular or very fast heartbeat.  You have pain with breathing.  You have repeated fainting or seizure-like jerking during an episode.  You faint when sitting or lying down.  You have confusion.  You have trouble walking.  You have severe weakness.  You have vision problems. If you fainted, call your local emergency services (911 in [place] Do not drive yourself to the hospital.    This information is not intended to replace advice given to you by your health care provider. Make sure you discuss any questions you have with your health care provider.   Document Released: 02/18/2005 Document Revised: 07/05/2014 Document Reviewed: 04/19/2011 Elsevier Interactive Patient Education [employer]

## 2015-01-04 ENCOUNTER — Telehealth: Payer: Self-pay

## 2015-01-04 ENCOUNTER — Encounter: Payer: Self-pay | Admitting: *Deleted

## 2015-01-04 NOTE — Telephone Encounter (Signed)
Filled his meds at [facility] on 18th and now wants to fill the 2nd month on the date actually written for in [facility] usually [facility]--he is told he must wait til 11/14

## 2015-01-04 NOTE — Telephone Encounter (Signed)
Michael from [facility] called wanting to talk with Dr. Merla Richesoolittle about this pt. He wants to verify with him that the scripts he is bringing in are actually from Dr. Merla Richesoolittle. CB #  [phone]

## 2015-01-04 NOTE — Telephone Encounter (Signed)
Spoke with pharmacist. Dr. Merla Richesoolittle spoke with him earlier.

## 2015-01-06 ENCOUNTER — Telehealth: Payer: Self-pay

## 2015-01-06 ENCOUNTER — Ambulatory Visit: Payer: Medicaid Other | Admitting: Diagnostic Neuroimaging

## 2015-01-06 NOTE — Telephone Encounter (Signed)
Pt did not show for his appt with Dr. Marjory LiesPenumalli today.

## 2015-01-09 ENCOUNTER — Encounter: Payer: Self-pay | Admitting: Diagnostic Neuroimaging

## 2015-01-11 ENCOUNTER — Emergency Department (HOSPITAL_COMMUNITY)
Admission: EM | Admit: 2015-01-11 | Discharge: 2015-01-12 | Disposition: A | Discharge status: Home / Self Care | Payer: Medicaid Other | Attending: Emergency Medicine | Admitting: Emergency Medicine

## 2015-01-11 ENCOUNTER — Encounter: Payer: Medicaid Other | Admitting: Diagnostic Neuroimaging

## 2015-01-11 ENCOUNTER — Emergency Department (HOSPITAL_COMMUNITY): Payer: Medicaid Other

## 2015-01-11 ENCOUNTER — Encounter: Payer: Self-pay | Admitting: *Deleted

## 2015-01-11 DIAGNOSIS — Y998 Other external cause status: Secondary | ICD-10-CM | POA: Insufficient documentation

## 2015-01-11 DIAGNOSIS — Z79899 Other long term (current) drug therapy: Secondary | ICD-10-CM | POA: Insufficient documentation

## 2015-01-11 DIAGNOSIS — S29001A Unspecified injury of muscle and tendon of front wall of thorax, initial encounter: Secondary | ICD-10-CM | POA: Diagnosis present

## 2015-01-11 DIAGNOSIS — Y9301 Activity, walking, marching and hiking: Secondary | ICD-10-CM | POA: Insufficient documentation

## 2015-01-11 DIAGNOSIS — G8929 Other chronic pain: Secondary | ICD-10-CM | POA: Insufficient documentation

## 2015-01-11 DIAGNOSIS — Y9289 Other specified places as the place of occurrence of the external cause: Secondary | ICD-10-CM | POA: Insufficient documentation

## 2015-01-11 DIAGNOSIS — W01198A Fall on same level from slipping, tripping and stumbling with subsequent striking against other object, initial encounter: Secondary | ICD-10-CM | POA: Insufficient documentation

## 2015-01-11 DIAGNOSIS — Z8659 Personal history of other mental and behavioral disorders: Secondary | ICD-10-CM | POA: Insufficient documentation

## 2015-01-11 DIAGNOSIS — S20212A Contusion of left front wall of thorax, initial encounter: Secondary | ICD-10-CM | POA: Diagnosis not present

## 2015-01-11 DIAGNOSIS — Z720 Tobacco use: Secondary | ICD-10-CM | POA: Insufficient documentation

## 2015-01-11 LAB — BASIC METABOLIC PANEL
Anion gap: 6 (ref 5–15)
BUN: 12 mg/dL (ref 6–20)
CHLORIDE: 105 mmol/L (ref 101–111)
CO2: 30 mmol/L (ref 22–32)
CREATININE: 1.07 mg/dL (ref 0.61–1.24)
Calcium: 9.6 mg/dL (ref 8.9–10.3)
GFR calc non Af Amer: >60 mL/min (ref >60)
GLUCOSE: 59 mg/dL — AB (ref 65–99)
POTASSIUM: 3.8 mmol/L (ref 3.5–5.1)
Sodium: 141 mmol/L (ref 135–145)

## 2015-01-11 LAB — CBC
HEMATOCRIT: 40.1 % (ref 39.0–52.0)
Hemoglobin: 13.5 g/dL (ref 13.0–17.0)
MCH: 30.5 pg (ref 26.0–34.0)
MCHC: 33.7 g/dL (ref 30.0–36.0)
MCV: 90.7 fL (ref 78.0–100.0)
PLATELETS: 265 10*3/uL (ref 150–400)
RBC: 4.42 MIL/uL (ref 4.22–5.81)
RDW: 14.3 % (ref 11.5–15.5)
WBC: 11.4 10*3/uL — AB (ref 4.0–10.5)

## 2015-01-11 LAB — I-STAT TROPONIN, ED: Troponin i, poc: 0 ng/mL (ref 0.00–0.08)

## 2015-01-11 NOTE — ED Provider Notes (Signed)
By signing my name below, I, Ashley Leger, attest that this documentation has been prepared under the direction and in the presence of [name], DO.  Electronically Signed: Arlan Organ, ED Scribe. 01/11/2015. 12:11 AM.   TIME SEEN: 12:08 AM   CHIEF COMPLAINT:  Chief Complaint  Patient presents with  . Chest Pain     HPI:  HPI Comments: Omar Clark is a 34 y.o. male with history of chronic back pain on chronic pain medication who presents to the Emergency Department here after a fall sustained 5 days ago. Pt state he was walking his dog when he slipped on some leaves landing on his L side with arm stretched out. Hit his left ribs on the stairs.  No head trauma or LOC. He now c/o constant, ongoing pain to the chest. Pain is made worse with deep breathing. No alleviating factors at this time. No OTC medications or home remedies attempted prior to arrival. No recent fever, chills, nausea, vomiting, or shortness of breath. No prior history of blood clots. Denies any long distance travel. No recent surgeries or hospitalization. No leg swelling or pain.  PCP: No PCP Per Patient    ROS: See HPI Constitutional: no fever  Eyes: no drainage  ENT: no runny nose   Cardiovascular:  Positive chest pain  Resp: no SOB  GI: no vomiting GU: no dysuria Integumentary: no rash  Allergy: no hives  Musculoskeletal: no leg swelling  Neurological: no slurred speech ROS otherwise negative  PAST MEDICAL HISTORY/PAST SURGICAL HISTORY:  Past Medical History  Diagnosis Date  . Chronic back pain   . Depression     MEDICATIONS:  Prior to Admission medications   Medication Sig Start Date End Date Taking? Authorizing Provider  gabapentin (NEURONTIN) 300 MG capsule Take 2 capsules (600 mg total) by mouth at bedtime. 05/27/14   [name], MD  ibuprofen (ADVIL,MOTRIN) 200 MG tablet Take 800 mg by mouth every 6 (six) hours as needed.    Historical Provider, MD  omeprazole (PRILOSEC) 20 MG  capsule Take 1 capsule (20 mg total) by mouth daily. 12/05/14   [name], MD  oxycodone (ROXICODONE) 30 MG immediate release tablet Take 1.5 tablets (45 mg total) by mouth every 4 (four) hours as needed for pain. 12/05/14   [name], MD  oxycodone (ROXICODONE) 30 MG immediate release tablet Take 1.5 tablets (45 mg total) by mouth every 4 (four) hours as needed for pain. Patient not taking: Reported on 12/13/2014 12/05/14   [name], MD  OxyCODONE HCl ER 60 MG T12A Take 60 mg by mouth 2 (two) times daily. 12/05/14   [name], MD  OxyCODONE HCl ER 60 MG T12A Take 60 mg by mouth 2 (two) times daily. Patient not taking: Reported on 12/13/2014 12/05/14   [name], MD    ALLERGIES:  Allergies  Allergen Reactions  . Aspirin Hives    SOCIAL HISTORY:  Social History  Substance Use Topics  . Smoking status: Current Every Day Smoker -- 1.00 packs/day for 20 years    Types: Cigarettes  . Smokeless tobacco: Never Used  . Alcohol Use: No    FAMILY HISTORY: Family History  Problem Relation Age of Onset  . Cancer Mother   . Cancer Maternal Grandmother   . Cancer Maternal Grandfather   . Cancer Paternal Grandmother   . Alzheimer's disease Paternal Grandfather     EXAM: BP 122/71 mmHg  Pulse 76  TArlan Organmp(Src)  98.2 F (36.8 C) (Oral)  Resp 13  SpO2 98% CONSTITUTIONAL: Alert and oriented and responds appropriately to questions. Well-appearing; well-nourished HEAD: Normocephalic, atraumatic EYES: Conjunctivae clear, PERRL ENT: normal nose; no rhinorrhea; moist mucous membranes; pharynx without lesions noted NECK: Supple, no meningismus, no LAD, no midline spinal tenderness or step-off or deformity  CARD: RRR; S1 and S2 appreciated; no murmurs, no clicks, no rubs, no gallops CHEST: Minimally tender over the left chest wall without crepitus, ecchymosis or deformity, no rash RESP: Normal chest excursion without splinting or tachypnea; breath sounds  clear and equal bilaterally; no wheezes, no rhonchi, no rales, no hypoxia or respiratory distress, speaking full sentences ABD/GI: Normal bowel sounds; non-distended; soft, non-tender, no rebound, no guarding, no peritoneal signs BACK:  The back appears normal and is non-tender to palpation, there is no CVA tenderness, no midline spinal tenderness or step-off or deformity EXT: Normal ROM in all joints; non-tender to palpation; no edema; normal capillary refill; no cyanosis, no calf tenderness or swelling    SKIN: Normal color for age and race; warm NEURO: Moves all extremities equally, sensation to light touch intact diffusely, cranial nerves II through XII intact PSYCH: The patient's mood and manner are appropriate. Grooming and personal hygiene are appropriate.  MEDICAL DECISION MAKING: Patient here with chest wall pain after a fall 5 days ago. No other injury on exam. States he has narcotic pain medication at home. Labs ordered in triage are unremarkable. EKG shows early repolarization which is unchanged. Chest x-ray clear without rib fractures, infiltrate, pneumothorax. We'll give incentive spirometer. He has pain medication at home. Denies any narcotics in the ED. Agrees to take IM injection of Toradol for pain. He has no risk factors for pulmonary embolus and is PERC negative.    I do not feel there is any life-threatening condition present. Discussed all results, exam findings with patient. I feel the patient is safe to be discharged home without further emergent workup. Discussed usual and customary return precautions. Patient and family (if present) verbalize understanding and are comfortable with this plan.  Patient will follow-up with their primary care provider. If they do not have a primary care provider, information for follow-up has been provided to them. All questions have been answered.       EKG Interpretation  Date/Time:  Wednesday January 11 2015 22:45:58 EST Ventricular Rate:   73 PR Interval:  209 QRS Duration: 90 QT Interval:  366 QTC Calculation: 403 R Axis:   68 Text Interpretation:  Sinus rhythm Borderline prolonged PR interval ST elev, probable normal early repol pattern No significant change since last tracing Confirmed by WARD,  DO, KRISTEN [phone]([id number]) on 01/11/2015 11:40:55 PM         I personally performed the services described in this documentation, which was scribed in my presence. The recorded information has been reviewed and is accurate.   Layla MawKristen N Ward, DO 01/12/15 [phone]

## 2015-01-11 NOTE — Progress Notes (Deleted)
Subjective:    Patient ID: Omar Clark is a 34 y.o. male.  HPI {Common ambulatory SmartLinks:[id number]}  Review of Systems  Objective:  Neurologic Exam  Physical Exam  Assessment:   ***  Plan:   ***

## 2015-01-11 NOTE — ED Notes (Signed)
Pt fell three days ago ans injured his ribs on the left side, he now has pain in the center of his chest and when he takes a deep breath

## 2015-01-12 MED ORDER — KETOROLAC TROMETHAMINE 60 MG/2ML IM SOLN
60.0000 mg | Freq: Once | INTRAMUSCULAR | Status: AC
  Administered 2015-01-12: 60 mg via INTRAMUSCULAR
  Filled 2015-01-12: qty 2

## 2015-01-12 MED ORDER — IBUPROFEN 800 MG PO TABS: 800.0000 mg | ORAL_TABLET | Freq: Three times a day (TID) | ORAL | Status: DC | PRN

## 2015-01-12 NOTE — Discharge Instructions (Signed)
Chest Contusion A chest contusion is a deep bruise on your chest area. Contusions are the result of an injury that caused bleeding under the skin. A chest contusion may involve bruising of the skin, muscles, or ribs. The contusion may turn blue, purple, or yellow. Minor injuries will give you a painless contusion, but more severe contusions may stay painful and swollen for a few weeks. CAUSES  A contusion is usually caused by a blow, trauma, or direct force to an area of the body. SYMPTOMS   Swelling and redness of the injured area.  Discoloration of the injured area.  Tenderness and soreness of the injured area.  Pain. DIAGNOSIS  The diagnosis can be made by taking a history and performing a physical exam. An X-ray, CT scan, or MRI may be needed to determine if there were any associated injuries, such as broken bones (fractures) or internal injuries. TREATMENT  Often, the best treatment for a chest contusion is resting, icing, and applying cold compresses to the injured area. Deep breathing exercises may be recommended to reduce the risk of pneumonia. Over-the-counter medicines may also be recommended for pain control. HOME CARE INSTRUCTIONS   Put ice on the injured area.  Put ice in a plastic bag.  Place a towel between your skin and the bag.  Leave the ice on for 15-20 minutes, 03-04 times a day.  Only take over-the-counter or prescription medicines as directed by your caregiver. Your caregiver may recommend avoiding anti-inflammatory medicines (aspirin, ibuprofen, and naproxen) for 48 hours because these medicines may increase bruising.  Rest the injured area.  Perform deep-breathing exercises as directed by your caregiver.  Stop smoking if you smoke.  Do not lift objects over 5 pounds (2.3 kg) for 3 days or longer if recommended by your caregiver. SEEK IMMEDIATE MEDICAL CARE IF:   You have increased bruising or swelling.  You have pain that is getting worse.  You have  difficulty breathing.  You have dizziness, weakness, or fainting.  You have blood in your urine or stool.  You cough up or vomit blood.  Your swelling or pain is not relieved with medicines. MAKE SURE YOU:   Understand these instructions.  Will watch your condition.  Will get help right away if you are not doing well or get worse.   This information is not intended to replace advice given to you by your health care provider. Make sure you discuss any questions you have with your health care provider.   Document Released: 11/13/2000 Document Revised: 11/13/2011 Document Reviewed: 08/12/2011 Elsevier Interactive Patient Education 2016 [employer]  

## 2015-01-17 NOTE — Progress Notes (Signed)
This encounter was created in error - please disregard.

## 2015-01-20 ENCOUNTER — Other Ambulatory Visit: Payer: Self-pay | Admitting: Internal Medicine

## 2015-01-20 ENCOUNTER — Telehealth: Payer: Self-pay | Admitting: Family Medicine

## 2015-01-20 MED ORDER — OXYCODONE HCL 30 MG PO TABS: 45.0000 mg | ORAL_TABLET | ORAL | Status: DC | PRN

## 2015-01-20 MED ORDER — OXYCODONE HCL ER 60 MG PO T12A: 60.0000 mg | EXTENDED_RELEASE_TABLET | Freq: Two times a day (BID) | ORAL | Status: DC
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
## 2015-01-31 ENCOUNTER — Ambulatory Visit: Payer: Medicaid Other | Admitting: Diagnostic Neuroimaging

## 2015-02-01 ENCOUNTER — Encounter: Payer: Self-pay | Admitting: Diagnostic Neuroimaging

## 2015-02-13 ENCOUNTER — Ambulatory Visit (INDEPENDENT_AMBULATORY_CARE_PROVIDER_SITE_OTHER): Payer: Self-pay | Admitting: Internal Medicine

## 2015-02-13 VITALS — BP 122/70 | HR 77 | Temp 98.4°F | Resp 16 | Ht 73.0 in | Wt 170.0 lb

## 2015-02-13 DIAGNOSIS — M5136 Other intervertebral disc degeneration, lumbar region: Secondary | ICD-10-CM

## 2015-02-13 DIAGNOSIS — G894 Chronic pain syndrome: Secondary | ICD-10-CM

## 2015-02-13 MED ORDER — OXYCODONE HCL ER 60 MG PO T12A: 60.0000 mg | EXTENDED_RELEASE_TABLET | Freq: Two times a day (BID) | ORAL | Status: DC

## 2015-02-13 MED ORDER — OXYCODONE HCL 30 MG PO TABS: 45.0000 mg | ORAL_TABLET | ORAL | Status: DC | PRN
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
## 2015-03-14 ENCOUNTER — Ambulatory Visit (INDEPENDENT_AMBULATORY_CARE_PROVIDER_SITE_OTHER): Payer: Self-pay | Admitting: Internal Medicine

## 2015-03-14 VITALS — BP 120/86 | HR 87 | Temp 98.0°F | Resp 20 | Ht 71.0 in | Wt 165.4 lb

## 2015-03-14 DIAGNOSIS — L309 Dermatitis, unspecified: Secondary | ICD-10-CM

## 2015-03-14 DIAGNOSIS — R140 Abdominal distension (gaseous): Secondary | ICD-10-CM

## 2015-03-14 DIAGNOSIS — G894 Chronic pain syndrome: Secondary | ICD-10-CM

## 2015-03-14 MED ORDER — MUPIROCIN 2 % EX OINT: TOPICAL_OINTMENT | CUTANEOUS | Status: DC

## 2015-03-14 MED ORDER — TRIAMCINOLONE ACETONIDE 0.1 % EX CREA: 1.0000 "application " | TOPICAL_CREAM | Freq: Two times a day (BID) | CUTANEOUS | Status: DC

## 2015-03-14 MED ORDER — OXYCODONE HCL 30 MG PO TABS: 45.0000 mg | ORAL_TABLET | ORAL | Status: DC | PRN

## 2015-03-14 MED ORDER — OXYCODONE HCL ER 60 MG PO T12A: 60.0000 mg | EXTENDED_RELEASE_TABLET | Freq: Two times a day (BID) | ORAL | Status: DC

## 2015-03-14 NOTE — Progress Notes (Signed)
Subjective:  By signing my name below, I, Omar Clark, attest that this documentation has been prepared under the direction and in the presence of [name], MD.  Watt Climes Clark, Medical Scribe. 03/14/2015.  5:27 PM.   Patient ID: Omar Clark, male    DOB: 24-Apr-1980, 35 y.o.   MRN: [id number]  Chief Complaint  Patient presents with  . Skin Problem    hands, abdominal area.    HPI HPI Comments: DAILYN Omar Clark is a 35 y.o. male who presents to [facility] complaining of a skin rash on his hands. Pt states that the rash first appears as a blister with a pus inside, and he indicates that when he drains the area that is when the rash starts healing, however the healing period is very long that lasts for months.   Pt also reports that presence of an intermittent "knot" is his stomach as if he is "pregnant with a baby", first onset 2-3 weeks ago. He reports that the area is very tender to palpation, however the symptoms resolve with no interventions required. He denies experiencing any other GI complications. Pt is compliant with taking with oxycodone.    Patient Active Problem List   Diagnosis Date Noted  . Chronic urticaria 04/14/2013  . Smoker 03/10/2013  . RAD (reactive airway disease) 03/10/2013  . Opiate addiction (HCC) 11/29/2012  . Lumbar degenerative disc disease 11/29/2012  . Chronic pain syndrome 11/29/2012   Past Medical History  Diagnosis Date  . Chronic back pain   . Depression    Past Surgical History  Procedure Laterality Date  . Open reduction internal fixation (orif) hand     Allergies  Allergen Reactions  . Aspirin Hives   Prior to Admission medications   Medication Sig Start Date End Date Taking? Authorizing Provider  gabapentin (NEURONTIN) 300 MG capsule Take 2 capsules (600 mg total) by mouth at bedtime. 05/27/14  Yes [name], MD  ibuprofen (ADVIL,MOTRIN) 800 MG tablet Take 1 tablet (800 mg total) by  mouth every 8 (eight) hours as needed for mild pain. 01/12/15  Yes [name], DO  omeprazole (PRILOSEC) 20 MG capsule Take 1 capsule (20 mg total) by mouth daily. 12/05/14  Yes [name], MD  oxyCODONE (OXYCONTIN) 60 MG 12 hr tablet Take 60 mg by mouth 2 (two) times daily. 02/13/15  Yes [name], MD  oxyCODONE (OXYCONTIN) 60 MG 12 hr tablet Take 60 mg by mouth 2 (two) times daily. 02/13/15   [name], MD  oxycodone (ROXICODONE) 30 MG immediate release tablet Take 1.5 tablets (45 mg total) by mouth every 4 (four) hours as needed for pain. 02/13/15   [name], MD  oxycodone (ROXICODONE) 30 MG immediate release tablet Take 1.5 tablets (45 mg total) by mouth every 4 (four) hours as needed for pain. 02/13/15   [name], MD   Social History   Social History  . Marital Status: Married    Spouse Name: Omar Clark  . Number of Children: 3  . Years of Education: 12   Occupational History  . Not on file.   Social History Main Topics  . Smoking status: Current Every Day Smoker -- 1.00 packs/day for 20 years    Types: Cigarettes  . Smokeless tobacco: Never Used  . Alcohol Use: No  . Drug Use: No  . Sexual Activity: Not on file   Other Topics Concern  . Not on  file   Social History Narrative    Review of Systems  Gastrointestinal: Positive for abdominal pain. Negative for diarrhea, constipation and blood in stool.  Skin: Positive for rash.      Objective:   Physical Exam  Constitutional: He is oriented to person, place, and time. He appears well-developed and well-nourished. No distress.  HENT:  Head: Normocephalic and atraumatic.  Eyes: EOM are normal. Pupils are equal, round, and reactive to light.  Neck: Neck supple.  Cardiovascular: Normal rate.   Pulmonary/Chest: Effort normal.  Abdominal:  benign  Neurological: He is alert and oriented to person, place, and time. No cranial nerve deficit.  Skin: Skin is warm and dry.  Has several  papules there are healin follicular eruptions on hands (Not palms)--no clear pattern. No active pustules. Also dry skin  Psychiatric: He has a normal mood and affect. His behavior is normal.  Nursing note and vitals reviewed.   BP 120/86 mmHg  Pulse 87  Temp(Src) 98 F (36.7 C) (Oral)  Resp 20  Ht 5\' 11"  (1.803 m)  Wt 165 lb 6.4 oz (75.025 kg)  BMI 23.08 kg/m2  SpO2 97%     Assessment & Plan:  Chronic pain syndrome--will send back to [facility] for further management as my retirement approaches  Eczema of both hands--if topicals fail will consult derm thru PCP  Abdominal bloating--ref to PCP [name] as he may need imaging--could be opiate induced loss of intest motility  Meds ordered this encounter  Medications  . DISCONTD: oxycodone (ROXICODONE) 30 MG immediate release tablet    Sig: Take 1.5 tablets (45 mg total) by mouth every 4 (four) hours as needed for pain.    Dispense:  270 tablet    Refill:  0  . triamcinolone cream (KENALOG) 0.1 %    Sig: Apply 1 application topically 2 (two) times daily. To prevent new lesions    Dispense:  30 g    Refill:  1  . mupirocin ointment (BACTROBAN) 2 %    Sig: Apply twice a day to clear infection    Dispense:  22 g    Refill:  1  . oxyCODONE (OXYCONTIN) 60 MG 12 hr tablet    Sig: Take 60 mg by mouth 2 (two) times daily. For 04/14/15 or after    Dispense:  60 each    Refill:  0  . oxycodone (ROXICODONE) 30 MG immediate release tablet    Sig: Take 1.5 tablets (45 mg total) by mouth every 4 (four) hours as needed for pain. For 04/14/15 or after    Dispense:  270 tablet    Refill:  0   Replaced lost written rx for 30   I have completed the patient encounter in its entirety as documented by the scribe, with editing by me where necessary. Durand Wittmeyer P. Merla Richesoolittle, M.D.

## 2015-05-05 ENCOUNTER — Ambulatory Visit (INDEPENDENT_AMBULATORY_CARE_PROVIDER_SITE_OTHER): Payer: Self-pay | Admitting: Internal Medicine

## 2015-05-05 VITALS — BP 124/70 | HR 88 | Temp 98.5°F | Resp 16 | Ht 71.0 in | Wt 167.0 lb

## 2015-05-05 DIAGNOSIS — G894 Chronic pain syndrome: Secondary | ICD-10-CM

## 2015-05-05 DIAGNOSIS — J988 Other specified respiratory disorders: Secondary | ICD-10-CM

## 2015-05-05 DIAGNOSIS — J4520 Mild intermittent asthma, uncomplicated: Secondary | ICD-10-CM

## 2015-05-05 DIAGNOSIS — J22 Unspecified acute lower respiratory infection: Secondary | ICD-10-CM

## 2015-05-05 DIAGNOSIS — M549 Dorsalgia, unspecified: Secondary | ICD-10-CM

## 2015-05-05 DIAGNOSIS — M5136 Other intervertebral disc degeneration, lumbar region: Secondary | ICD-10-CM

## 2015-05-05 DIAGNOSIS — G8929 Other chronic pain: Secondary | ICD-10-CM

## 2015-05-05 MED ORDER — OXYCODONE HCL ER 80 MG PO T12A: 80.0000 mg | EXTENDED_RELEASE_TABLET | Freq: Two times a day (BID) | ORAL | Status: DC

## 2015-05-05 MED ORDER — AZITHROMYCIN 250 MG PO TABS: ORAL_TABLET | ORAL | Status: DC

## 2015-05-05 MED ORDER — PREDNISONE 20 MG PO TABS
ORAL_TABLET | ORAL | Status: DC
Start: 2015-05-05 — End: 2015-05-25

## 2015-05-05 MED ORDER — OXYCODONE HCL 30 MG PO TABS: 45.0000 mg | ORAL_TABLET | ORAL | Status: DC | PRN

## 2015-05-05 NOTE — Progress Notes (Signed)
Subjective:  By signing my name below, I, [name], attest that this documentation has been prepared under the direction and in the presence of [name], MD Electronically Signed: Charline Bills, ED Scribe 05/05/2015 at 6:21 PM.   Patient ID: Omar Clark, male    DOB: September 14, 1980, 35 y.o.   MRN: [id number]  Chief Complaint  Patient presents with  . Fever    101.9 at home  . Nasal Congestion  . Cough   HPI  HPI Comments Omar Clark is a 35 y.o. male who presents to the [facility] complaining of fever with Tmax of 101.9 F for the past 4 days. Triage temperature of 98.5 F. Pt reports associated productive cough with green sputum, nasal congestion, sinus pressure, ear pain for the past 3 weeks. He has tried Advil and ibuprofen with temporary relief. Pt's children have been sick with similar symptoms.   Pain Medication Pt states that he currently takes 60 mg Oxycodone around 5 AM and another 60 mg at 5 PM daily. 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 At this visit, pt expresses his desire to switch to 80 mg Oxycodone and hopefully take less of the 30 mg. he maintains that his pain is incompletely control and that this affects his work duties.  Past Medical History  Diagnosis Date  . Chronic back pain   . Depression    Current Outpatient Prescriptions on File Prior to Visit  Medication Sig Dispense Refill  . gabapentin (NEURONTIN) 300 MG capsule Take 2 capsules (600 mg total) by mouth at bedtime. 60 capsule 5  . ibuprofen (ADVIL,MOTRIN) 800 MG tablet Take 1 tablet (800 mg total) by mouth every 8 (eight) hours as needed for mild pain. 30 tablet 0  . omeprazole (PRILOSEC) 20 MG capsule Take 1 capsule (20 mg total) by mouth daily. 30 capsule 5  . oxyCODONE (OXYCONTIN) 60 MG 12 hr tablet Take 60 mg by mouth 2 (two) times daily. 60 each 0  . oxyCODONE (OXYCONTIN) 60 MG 12 hr tablet  Take 60 mg by mouth 2 (two) times daily. For 04/14/15 or after 60 each 0  . oxycodone (ROXICODONE) 30 MG immediate release tablet Take 1.5 tablets (45 mg total) by mouth every 4 (four) hours as needed for pain. 270 tablet 0  . oxycodone (ROXICODONE) 30 MG immediate release tablet Take 1.5 tablets (45 mg total) by mouth every 4 (four) hours as needed for pain. For 04/14/15 or after 270 tablet 0  . mupirocin ointment (BACTROBAN) 2 % Apply twice a day to clear infection (Patient not taking: Reported on 05/05/2015) 22 g 1  . triamcinolone cream (KENALOG) 0.1 % Apply 1 application topically 2 (two) times daily. To prevent new lesions (Patient not taking: Reported on 05/05/2015) 30 g 1   No current facility-administered medications on file prior to visit.   Allergies  Allergen Reactions  . Aspirin Hives   Review of Systems  Constitutional: Positive for fever.  HENT: Positive for congestion, ear pain and sinus pressure.   Respiratory: Positive for cough.   Musculoskeletal: Positive for back pain (chronic).      Objective:   Physical Exam  Constitutional: He is oriented to person, place, and time. He appears well-developed and well-nourished. No distress.  HENT:  Head: Normocephalic and atraumatic.  Nose: Rhinorrhea present.  Purulent discharge in nares.    Eyes: Conjunctivae and EOM  are normal.  Neck: Neck supple.  No cervical nodes.   Cardiovascular: Normal rate.   Pulmonary/Chest: Effort normal. No respiratory distress. He has wheezes. He has rhonchi.  Wheezing on expiration. Rhonchi at both bases.  Musculoskeletal: Normal range of motion.  Neurological: He is alert and oriented to person, place, and time.  Skin: Skin is warm and dry.  Psychiatric: He has a normal mood and affect. His behavior is normal.  Nursing note and vitals reviewed. BP 124/70 mmHg  Pulse 88  Temp(Src) 98.5 F (36.9 C)  Resp 16  Ht 5\' 11"  (1.803 m)  Wt 167 lb (75.751 kg)  BMI 23.30 kg/m2  SpO2 98%       Assessment & Plan:  Chronic pain syndrome --- We will increase his baseline pain medication to see if this enables him to decrease his when necessary medication which he is currently taking around-the-clock.  Back pain, chronic --- Still needs orthopedic evaluation   Lumbar degenerative disc disease  Reactive airway disease, mild intermittent, uncomplicated secondary to Lower respiratory infection    Meds ordered this encounter  Medications  . azithromycin (ZITHROMAX) 250 MG tablet    Sig: As packaged    Dispense:  6 tablet    Refill:  0  . predniSONE (DELTASONE) 20 MG tablet    Sig: 3/3/2/2/1/1 single daily dose for 6 days    Dispense:  12 tablet    Refill:  0  . oxyCODONE (OXYCONTIN) 80 mg 12 hr tablet    Sig: Take 1 tablet (80 mg total) by mouth every 12 (twelve) hours. For 05/12/15 or after    Dispense:  60 tablet    Refill:  0  . oxycodone (ROXICODONE) 30 MG immediate release tablet    Sig: Take 1.5 tablets (45 mg total) by mouth every 4 (four) hours as needed for pain. For 05/12/15 or after    Dispense:  270 tablet    Refill:  0   He will go ahead and try to establish with his new pain follow-up He asked for the permission to refill his new medication early because he is afraid of a prior authorization. I told him to go ahead and discuss with the pharmacist the need for this medication to be ready when he runs out of his current dose.  Please review letter I sent to his primary care provider Dr. [name] He understands that we will no longer be able to provide this medication for chronic pain management at [facility] after I retire  I have completed the patient encounter in its entirety as documented by the scribe, with editing by me where necessary. [place] Rommel P. Merla Richesoolittle, M.D.

## 2015-05-08 ENCOUNTER — Encounter: Payer: Self-pay | Admitting: Internal Medicine

## 2015-05-20 ENCOUNTER — Inpatient Hospital Stay (HOSPITAL_COMMUNITY)
Admission: EM | Admit: 2015-05-20 | Discharge: 2015-05-25 | DRG: 897 | Disposition: A | Discharge status: Home / Self Care | Payer: Medicaid Other | Source: Intra-hospital | Attending: Psychiatry | Admitting: Psychiatry

## 2015-05-20 ENCOUNTER — Encounter (HOSPITAL_COMMUNITY): Payer: Self-pay

## 2015-05-20 ENCOUNTER — Emergency Department (HOSPITAL_COMMUNITY)
Admission: EM | Admit: 2015-05-20 | Discharge: 2015-05-20 | Disposition: A | Discharge status: Home / Self Care | Payer: Medicaid Other | Source: Home / Self Care

## 2015-05-20 ENCOUNTER — Encounter (HOSPITAL_COMMUNITY): Payer: Self-pay | Admitting: Emergency Medicine

## 2015-05-20 ENCOUNTER — Emergency Department (HOSPITAL_COMMUNITY)
Admission: EM | Admit: 2015-05-20 | Discharge: 2015-05-20 | Disposition: A | Discharge status: Other Acute Inpatient Hospital | Payer: Medicaid Other | Attending: Physician Assistant | Admitting: Physician Assistant

## 2015-05-20 DIAGNOSIS — F419 Anxiety disorder, unspecified: Secondary | ICD-10-CM

## 2015-05-20 DIAGNOSIS — F1120 Opioid dependence, uncomplicated: Secondary | ICD-10-CM | POA: Diagnosis present

## 2015-05-20 DIAGNOSIS — Z79899 Other long term (current) drug therapy: Secondary | ICD-10-CM | POA: Insufficient documentation

## 2015-05-20 DIAGNOSIS — J45909 Unspecified asthma, uncomplicated: Secondary | ICD-10-CM | POA: Diagnosis present

## 2015-05-20 DIAGNOSIS — F1110 Opioid abuse, uncomplicated: Secondary | ICD-10-CM | POA: Insufficient documentation

## 2015-05-20 DIAGNOSIS — G8929 Other chronic pain: Secondary | ICD-10-CM

## 2015-05-20 DIAGNOSIS — F329 Major depressive disorder, single episode, unspecified: Secondary | ICD-10-CM | POA: Diagnosis not present

## 2015-05-20 DIAGNOSIS — F1994 Other psychoactive substance use, unspecified with psychoactive substance-induced mood disorder: Secondary | ICD-10-CM | POA: Diagnosis present

## 2015-05-20 DIAGNOSIS — F1510 Other stimulant abuse, uncomplicated: Secondary | ICD-10-CM | POA: Diagnosis not present

## 2015-05-20 DIAGNOSIS — R440 Auditory hallucinations: Secondary | ICD-10-CM | POA: Diagnosis not present

## 2015-05-20 DIAGNOSIS — G4700 Insomnia, unspecified: Secondary | ICD-10-CM | POA: Diagnosis not present

## 2015-05-20 DIAGNOSIS — F17210 Nicotine dependence, cigarettes, uncomplicated: Secondary | ICD-10-CM | POA: Insufficient documentation

## 2015-05-20 DIAGNOSIS — M549 Dorsalgia, unspecified: Secondary | ICD-10-CM | POA: Diagnosis present

## 2015-05-20 DIAGNOSIS — F19950 Other psychoactive substance use, unspecified with psychoactive substance-induced psychotic disorder with delusions: Secondary | ICD-10-CM | POA: Diagnosis present

## 2015-05-20 DIAGNOSIS — Z809 Family history of malignant neoplasm, unspecified: Secondary | ICD-10-CM

## 2015-05-20 DIAGNOSIS — F15150 Other stimulant abuse with stimulant-induced psychotic disorder with delusions: Secondary | ICD-10-CM | POA: Diagnosis present

## 2015-05-20 DIAGNOSIS — F22 Delusional disorders: Secondary | ICD-10-CM | POA: Diagnosis present

## 2015-05-20 DIAGNOSIS — F11150 Opioid abuse with opioid-induced psychotic disorder with delusions: Principal | ICD-10-CM | POA: Diagnosis present

## 2015-05-20 DIAGNOSIS — Z820 Family history of epilepsy and other diseases of the nervous system: Secondary | ICD-10-CM

## 2015-05-20 DIAGNOSIS — Z23 Encounter for immunization: Secondary | ICD-10-CM

## 2015-05-20 DIAGNOSIS — F1590 Other stimulant use, unspecified, uncomplicated: Secondary | ICD-10-CM | POA: Clinically undetermined

## 2015-05-20 DIAGNOSIS — G894 Chronic pain syndrome: Secondary | ICD-10-CM | POA: Diagnosis present

## 2015-05-20 DIAGNOSIS — F410 Panic disorder [episodic paroxysmal anxiety] without agoraphobia: Secondary | ICD-10-CM | POA: Diagnosis present

## 2015-05-20 LAB — COMPREHENSIVE METABOLIC PANEL
ALT: 16 U/L — AB (ref 17–63)
ANION GAP: 9 (ref 5–15)
AST: 12 U/L — ABNORMAL LOW (ref 15–41)
Albumin: 4.3 g/dL (ref 3.5–5.0)
Alkaline Phosphatase: 72 U/L (ref 38–126)
BUN: 14 mg/dL (ref 6–20)
CHLORIDE: 104 mmol/L (ref 101–111)
CO2: 28 mmol/L (ref 22–32)
CREATININE: 1.09 mg/dL (ref 0.61–1.24)
Calcium: 10.1 mg/dL (ref 8.9–10.3)
Glucose, Bld: 82 mg/dL (ref 65–99)
Potassium: 4.2 mmol/L (ref 3.5–5.1)
SODIUM: 141 mmol/L (ref 135–145)
Total Bilirubin: 0.4 mg/dL (ref 0.3–1.2)
Total Protein: 7.4 g/dL (ref 6.5–8.1)

## 2015-05-20 LAB — ACETAMINOPHEN LEVEL: Acetaminophen (Tylenol), Serum: <10 ug/mL — ABNORMAL LOW (ref 10–30)

## 2015-05-20 LAB — CBC WITH DIFFERENTIAL/PLATELET
BASOS ABS: 0 10*3/uL (ref 0.0–0.1)
Basophils Relative: 0 %
EOS ABS: 0.4 10*3/uL (ref 0.0–0.7)
EOS PCT: 5 %
HCT: 40.6 % (ref 39.0–52.0)
HEMOGLOBIN: 14.2 g/dL (ref 13.0–17.0)
LYMPHS PCT: 42 %
Lymphs Abs: 3.2 10*3/uL (ref 0.7–4.0)
MCH: 30.1 pg (ref 26.0–34.0)
MCHC: 35 g/dL (ref 30.0–36.0)
MCV: 86 fL (ref 78.0–100.0)
Monocytes Absolute: 1 10*3/uL (ref 0.1–1.0)
Monocytes Relative: 13 %
NEUTROS PCT: 40 %
Neutro Abs: 3 10*3/uL (ref 1.7–7.7)
PLATELETS: 230 10*3/uL (ref 150–400)
RBC: 4.72 MIL/uL (ref 4.22–5.81)
RDW: 14.6 % (ref 11.5–15.5)
WBC: 7.7 10*3/uL (ref 4.0–10.5)

## 2015-05-20 LAB — ETHANOL

## 2015-05-20 LAB — RAPID URINE DRUG SCREEN, HOSP PERFORMED
AMPHETAMINES: POSITIVE — AB
BENZODIAZEPINES: NOT DETECTED
Barbiturates: NOT DETECTED
Cocaine: NOT DETECTED
OPIATES: POSITIVE — AB
TETRAHYDROCANNABINOL: NOT DETECTED

## 2015-05-20 LAB — SALICYLATE LEVEL: Salicylate Lvl: <4 mg/dL (ref 2.8–30.0)

## 2015-05-20 MED ORDER — ALUM & MAG HYDROXIDE-SIMETH 200-200-20 MG/5ML PO SUSP: 30.0000 mL | ORAL | Status: DC | PRN

## 2015-05-20 MED ORDER — GABAPENTIN 300 MG PO CAPS
600.0000 mg | ORAL_CAPSULE | Freq: Two times a day (BID) | ORAL | Status: DC
  Administered 2015-05-20 – 2015-05-21 (×2): 600 mg via ORAL
  Filled 2015-05-20 (×2): qty 2

## 2015-05-20 MED ORDER — ACETAMINOPHEN 325 MG PO TABS: 650.0000 mg | ORAL_TABLET | Freq: Four times a day (QID) | ORAL | Status: DC | PRN

## 2015-05-20 MED ORDER — TRAZODONE HCL 50 MG PO TABS
50.0000 mg | ORAL_TABLET | Freq: Every evening | ORAL | Status: DC | PRN
  Administered 2015-05-21: 50 mg via ORAL
  Filled 2015-05-20: qty 1

## 2015-05-20 MED ORDER — NICOTINE 21 MG/24HR TD PT24
21.0000 mg | MEDICATED_PATCH | Freq: Every day | TRANSDERMAL | Status: DC
  Administered 2015-05-20 – 2015-05-21 (×2): 21 mg via TRANSDERMAL
  Filled 2015-05-20 (×2): qty 1

## 2015-05-20 MED ORDER — MAGNESIUM HYDROXIDE 400 MG/5ML PO SUSP: 30.0000 mL | Freq: Every day | ORAL | Status: DC | PRN

## 2015-05-20 MED ORDER — HYDROXYZINE HCL 25 MG PO TABS
25.0000 mg | ORAL_TABLET | Freq: Three times a day (TID) | ORAL | Status: DC | PRN
  Administered 2015-05-21: 25 mg via ORAL
  Filled 2015-05-20: qty 1

## 2015-05-20 NOTE — ED Notes (Signed)
[facility] called by charge RN [facility]

## 2015-05-20 NOTE — ED Notes (Signed)
MADE FIRST REQUEST FOR A URINE SAMPLE PT STATES UNABLE TO PROVIDE ONE AT THIS TIME

## 2015-05-20 NOTE — ED Provider Notes (Signed)
CSN: [id number]     Arrival date & time 05/20/15  0845 History   First MD Initiated Contact with Patient 05/20/15 [phone]     Chief Complaint  Patient presents with  . Insomnia     (Consider location/radiation/quality/duration/timing/severity/associated sxs/prior Treatment) HPI   Pt is a 35 yo male With past medical history of depression who presents to the ED with initial complaint of insomnia. Patient reports he has not been able to sleep for the past few days. Triage note states the patient was found sleeping in the lobby prior to being evaluated in the ED. Patient reports he has been more stressed and anxious recently and states "I do not feel safe going home". On further questioning patient reports that he thinks his wife is going to hurt him. He states she has whispered in his ear multiple times "you are going to die". Patient also endorses auditory hallucinations. He reports hearing multiple animals growling when he is at home or a at friend's house. Patient denies SI or HI. Denies alcohol or drug use. Patient denies any pain or complaints at this time.  Past Medical History  Diagnosis Date  . Chronic back pain   . Depression    Past Surgical History  Procedure Laterality Date  . Open reduction internal fixation (orif) hand     Family History  Problem Relation Age of Onset  . Cancer Mother   . Cancer Maternal Grandmother   . Cancer Maternal Grandfather   . Cancer Paternal Grandmother   . Alzheimer's disease Paternal Grandfather    Social History  Substance Use Topics  . Smoking status: Current Every Day Smoker -- 2.00 packs/day for 20 years    Types: Cigarettes  . Smokeless tobacco: Never Used  . Alcohol Use: No    Review of Systems  Psychiatric/Behavioral: Positive for hallucinations (auditory) and sleep disturbance (insomnia).  All other systems reviewed and are negative.     Allergies  Aspirin  Home Medications   Prior to Admission medications   Medication  Sig Start Date End Date Taking? Authorizing Provider  gabapentin (NEURONTIN) 300 MG capsule Take 2 capsules (600 mg total) by mouth at bedtime. 05/27/14  Yes Tonye Pearsonobert P Doolittle, MD  ibuprofen (ADVIL,MOTRIN) 800 MG tablet Take 1 tablet (800 mg total) by mouth every 8 (eight) hours as needed for mild pain. 01/12/15  Yes [name], DO  omeprazole (PRILOSEC) 20 MG capsule Take 1 capsule (20 mg total) by mouth daily. 12/05/14  Yes Tonye Pearsonobert P Doolittle, MD  oxyCODONE (OXYCONTIN) 80 mg 12 hr tablet Take 1 tablet (80 mg total) by mouth every 12 (twelve) hours. For 05/12/15 or after 05/05/15  Yes Tonye Pearsonobert P Doolittle, MD  oxycodone (ROXICODONE) 30 MG immediate release tablet Take 1.5 tablets (45 mg total) by mouth every 4 (four) hours as needed for pain. For 05/12/15 or after 05/05/15  Yes Tonye Pearsonobert P Doolittle, MD  azithromycin [facility](ZITHROMAX) 250 MG tablet As packaged 05/05/15   Tonye Pearsonobert P Doolittle, MD  predniSONE (DELTASONE) 20 MG tablet 3/3/2/2/1/1 single daily dose for 6 days 05/05/15   Tonye Pearsonobert P Doolittle, MD   BP 137/93 mmHg  Pulse 86  Temp(Src) 97.8 F (36.6 C) (Oral)  Resp 18  SpO2 98% Physical Exam  Constitutional: He is oriented to person, place, and time. He appears well-developed and well-nourished.  HENT:  Head: Normocephalic and atraumatic.  Mouth/Throat: Oropharynx is clear and moist. No oropharyngeal exudate.  Eyes: Conjunctivae and EOM are normal. Pupils are equal, round, and  reactive to light. Right eye exhibits no discharge. Left eye exhibits no discharge. No scleral icterus.  Neck: Normal range of motion. Neck supple.  Cardiovascular: Normal rate, regular rhythm, normal heart sounds and intact distal pulses.   Pulmonary/Chest: Effort normal and breath sounds normal. No respiratory distress. He has no wheezes. He has no rales. He exhibits no tenderness.  Abdominal: Soft. Bowel sounds are normal. He exhibits no distension and no mass. There is no tenderness. There is no rebound and no guarding.   Musculoskeletal: Normal range of motion. He exhibits no edema.  Lymphadenopathy:    He has no cervical adenopathy.  Neurological: He is alert and oriented to person, place, and time.  Skin: Skin is warm and dry.  Psychiatric: His speech is normal. His mood appears anxious. He is actively hallucinating. He expresses no homicidal and no suicidal ideation.  Nursing note and vitals reviewed.   ED Course  Procedures (including critical care time) Labs Review Labs Reviewed  COMPREHENSIVE METABOLIC PANEL - Abnormal; Notable for the following:    AST 12 (*)    ALT 16 (*)    All other components within normal limits  URINE RAPID DRUG SCREEN, HOSP PERFORMED - Abnormal; Notable for the following:    Opiates POSITIVE (*)    Amphetamines POSITIVE (*)    All other components within normal limits  ACETAMINOPHEN LEVEL - Abnormal; Notable for the following:    Acetaminophen (Tylenol), Serum <10 (*)    All other components within normal limits  ETHANOL  CBC WITH DIFFERENTIAL/PLATELET  SALICYLATE LEVEL    Imaging Review No results found. I have personally reviewed and evaluated these images and lab results as part of my medical decision-making.   EKG Interpretation None      MDM   Final diagnoses:  Auditory hallucinations    Patient presents with insomnia and reports having auditory hallucinations. He states he does not feel safe going home due to feeling that his wife will hurt him. Denies SI/HI. VSS. Exam unremarkable. UDS positive for opiates and amphetamines. Patient endorses taking hydrocodone for his chronic back pain and reports he took his friend's Adderall 2 days ago. Remaining labs unremarkable. Patient medically cleared. Consulted TTS. Behavioral Health recommends observation. I was notified that behavioral health observation unit is not currently open, as a result the pt will continue to be observed in the ED until the obs unit opens.    Satira Sark [place],  [place] 05/20/15 1322  Courteney Randall An, MD 05/21/15 [phone]

## 2015-05-20 NOTE — ED Notes (Addendum)
Pt reports he has been unable to sleep for the past 2 days and has been under stress at home. Denies SI/HI. Pt reports he napped in the waiting room at one point last night.

## 2015-05-20 NOTE — ED Notes (Signed)
[facility], T[facility] to get instructions on the care plan for the patient.  She is with another patient but a message was taken for her to call back or come see me to give the instructions on what to do next for the patient.

## 2015-05-20 NOTE — Progress Notes (Signed)
[facility] INPATIENT:  Family/Significant Other Suicide Prevention Education  Suicide Prevention Education:  Patient Refusal for Family/Significant Other Suicide Prevention Education: The patient Omar Clark has refused to provide written consent for family/significant other to be provided Family/Significant Other Suicide Prevention Education during admission and/or prior to discharge.  Physician notified.  Maurine SimmeringShugart, Sherra Kimmons M 05/20/2015, 6:32 PM

## 2015-05-20 NOTE — ED Notes (Signed)
Called 2 times- no answer

## 2015-05-20 NOTE — [facility] Counselor (Signed)
Patient reviewed and signed voluntary consent to treat to the observation unit and ROI to no one. Patient asked if he is able to sleep when he gets there reporting that he needs to sleep.  Patient was informed that he is able to sleep.  Patient denies other questions or concerns.  Paperwork provided for patients nurse to go twith [facility].   Davina PokeJoVea Bianka Liberati, LCSW Therapeutic Triage Specialist [facility] 05/20/2015 2:46 PM

## 2015-05-20 NOTE — Progress Notes (Signed)
Admission note: Patient (prefers to go by his middle name Greer PickerelCoy) was admitted and oriented to obs unit. He denies SI/HI/AVH. He admits chronic lower back back/pinched nerve pain. He came to ED with the chief complaint of insomnia and reports feeling anxiety, depression, fatigue. He was calm and cooperative during admission. Will continue to monitor for needs/safety.

## 2015-05-20 NOTE — BH Assessment (Signed)
Patient has been accepted to observation per Simonne ComeLeo. EDP informed of acceptance.  Call [phone] or [phone] for nurse report.   Davina PokeJoVea Noeh Sparacino, LCSW Therapeutic Triage Specialist [facility] 05/20/2015 1:31 PM

## 2015-05-20 NOTE — ED Notes (Signed)
Bed: [facility] Expected date:  Expected time:  Means of arrival:  Comments: 

## 2015-05-20 NOTE — BH Assessment (Signed)
[facility] Assessment Progress Note Patient was seen by Porfirio OarUmberger LCAS on admission. Patient reports he has been hearing "strange noises like scratching outside his house" for the last few weeks. Patient cannot identify exactly what they are but feels he is "loosing it" at times. Patient reports he has been having marital problems and feels the excessive stress is causing his problems. Patient stated he hasn't been sleeping and is afraid to go to sleep because he doesn't trust his wife. Patient does report increased paranoia and admits to taking his friends Adderall to stay awake. Patient denies any current thoughts of self harm but is concerned about his AH. Patient is open to medication interventions to assist with sleep and increased stress. Patient is currently prescribed Oxycodone 30 mg for back pain. Patient will be seen by night extender for further evaluation.

## 2015-05-20 NOTE — ED Notes (Addendum)
Patient reports unable to sleep and wants to get it figured out. States he is feeling some anxiety.  York SpanielSaid friend gave him an adderral yesterday to help.  Reports to be hearing a drill running for the past 3 days.

## 2015-05-20 NOTE — BH Assessment (Addendum)
Assessment Note  Omar Clark is an 35 y.o. male presenting to [facility] for insomnia. Patient states that he has been difficulty sleeping due to his mind racing. Patient denies SI and history of attempts. Patient denies self-injurious behaviors. Patient denies HI and history of aggression. Patient denies access to firearms and weapons. Patient states that he has pending charges for breaking and entering and having methamphetamines but is not sure of his court date. Patient denies AVH but states "sometimes when I'm tired I heard people breathing or i see patterns." Patient denies hearing voices or hearing commands.   Patient does not appear to be responding to internal stimuli. Patient states that he started having problems sleeping for about six months ago after being admitted to the hospital for having "[facility] Spotted Fever" and reports that he has spots on his arms.  Patient states that his main concern is his difficulty sleeping.    Patient was assessed in his room in the ED and was dressed in his street clothes. Patient was laying down. Patient denies use of drugs and alcohol. Patient UDS positive for amphetamines and opiates and BAL <5.  Patient states that he feels anxious at times and endorses symptoms of depression as insomnia, fatigue, and loss of interest in pleasurable activities.  Patient states that his appetite is good.    Consulted with Dr. Jannifer Franklin who recommends patient be treated in the observation unit at this time.    Diagnosis: Amphetamine-type substance use disorder, Mild                     Amphetamine (or other stimulant)-induced sleep disorder, With mild use disorder   Past Medical History:  Past Medical History  Diagnosis Date  . Chronic back pain   . Depression     Past Surgical History  Procedure Laterality Date  . Open reduction internal fixation (orif) hand      Family History:  Family History  Problem Relation Age of Onset  . Cancer Mother   .  Cancer Maternal Grandmother   . Cancer Maternal Grandfather   . Cancer Paternal Grandmother   . Alzheimer's disease Paternal Grandfather     Social History:  reports that he has been smoking Cigarettes.  He has a 40 pack-year smoking history. He has never used smokeless tobacco. He reports that he does not drink alcohol or use illicit drugs.  Additional Social History:  Alcohol / Drug Use Pain Medications: See PTA Prescriptions: See PTA Over the Counter: See PTA History of alcohol / drug use?: No history of alcohol / drug abuse  CIWA: CIWA-Ar BP: 137/93 mmHg Pulse Rate: 86 COWS:    Allergies:  Allergies  Allergen Reactions  . Aspirin Hives    Home Medications:  (Not in a hospital admission)  OB/GYN Status:  No LMP for male patient.  General Assessment Data Location of Assessment: [facility] TTS Assessment: In system Is this a Tele or Face-to-Face Assessment?: Face-to-Face Is this an Initial Assessment or a Re-assessment for this encounter?: Initial Assessment Marital status: Married (10 years) Living Arrangements: Spouse/significant other, Children Can pt return to current living arrangement?: Yes Admission Status: Voluntary Is patient capable of signing voluntary admission?: Yes Referral Source: Self/Family/Friend     Crisis Care Plan Living Arrangements: Spouse/significant other, Children Name of Psychiatrist: None Name of Therapist: None  Education Status Is patient currently in school?: No Highest grade of school patient has completed: 12  Risk to self with the past  6 months Suicidal Ideation: No Has patient been a risk to self within the past 6 months prior to admission? : No Suicidal Intent: No Has patient had any suicidal intent within the past 6 months prior to admission? : No Is patient at risk for suicide?: No Suicidal Plan?: No Has patient had any suicidal plan within the past 6 months prior to admission? : No Access to Means: No What has been your  use of drugs/alcohol within the last 12 months?: Denies Previous Attempts/Gestures: No How many times?: 0 Other Self Harm Risks: Denies Triggers for Past Attempts: None known Intentional Self Injurious Behavior: None Family Suicide History: No Recent stressful life event(s): Conflict (Comment) (tinks his wife is having an affair) Persecutory voices/beliefs?: No Depression: Yes Depression Symptoms: Insomnia, Fatigue, Loss of interest in usual pleasures Substance abuse history and/or treatment for substance abuse?: No Suicide prevention information given to non-admitted patients: Not applicable  Risk to Others within the past 6 months Homicidal Ideation: No Does patient have any lifetime risk of violence toward others beyond the six months prior to admission? : No Thoughts of Harm to Others: No Current Homicidal Intent: No Current Homicidal Plan: No Access to Homicidal Means: No Identified Victim: Denies History of harm to others?: No Assessment of Violence: None Noted Violent Behavior Description: Denies Does patient have access to weapons?: No Criminal Charges Pending?: Yes Describe Pending Criminal Charges: B&E methamphetamine Does patient have a court date: Yes Court Date:  Otho Bellows) Is patient on probation?: No  Psychosis Hallucinations: None noted Delusions: None noted  Mental Status Report Appearance/Hygiene: Unremarkable Eye Contact: Fair Motor Activity: Unable to assess Speech: Logical/coherent Level of Consciousness: Alert Mood: Pleasant Affect: Appropriate to circumstance Anxiety Level: None Thought Processes: Coherent, Relevant Judgement: Partial Orientation: Person, Place, Time, Situation, Appropriate for developmental age Obsessive Compulsive Thoughts/Behaviors: None  Cognitive Functioning Concentration: Good Memory: Recent Intact, Remote Intact IQ: Average Insight: Fair Impulse Control: Fair Appetite: Good Sleep: Decreased Total Hours of Sleep:  4 Vegetative Symptoms: None  ADLScreening [facility] Assessment Services) Patient's cognitive ability adequate to safely complete daily activities?: Yes Patient able to express need for assistance with ADLs?: Yes Independently performs ADLs?: Yes (appropriate for developmental age)  Prior Inpatient Therapy Prior Inpatient Therapy: No Prior Therapy Dates: N/A Prior Therapy Facilty/Provider(s): N/A Reason for Treatment: N/A  Prior Outpatient Therapy Prior Outpatient Therapy: No Prior Therapy Dates: N/A Prior Therapy Facilty/Provider(s): N/A Reason for Treatment: N/A Does patient have an ACCT team?: No Does patient have Intensive In-House Services?  : No Does patient have [facility] services? : No Does patient have P4CC services?: No  ADL Screening (condition at time of admission) Patient's cognitive ability adequate to safely complete daily activities?: Yes Is the patient deaf or have difficulty hearing?: No Does the patient have difficulty seeing, even when wearing glasses/contacts?: No Does the patient have difficulty concentrating, remembering, or making decisions?: No Patient able to express need for assistance with ADLs?: Yes Does the patient have difficulty dressing or bathing?: No Independently performs ADLs?: Yes (appropriate for developmental age) Does the patient have difficulty walking or climbing stairs?: No Weakness of Legs: None Weakness of Arms/Hands: None  Home Assistive Devices/Equipment Home Assistive Devices/Equipment: None  Therapy Consults (therapy consults require a physician order) PT Evaluation Needed: No OT Evalulation Needed: No SLP Evaluation Needed: No Abuse/Neglect Assessment (Assessment to be complete while patient is alone) Physical Abuse: Denies Verbal Abuse: Denies Sexual Abuse: Denies Exploitation of patient/patient's resources: Denies Self-Neglect: Denies Values / Beliefs Cultural  Requests During Hospitalization: None Spiritual Requests  During Hospitalization: None Consults Spiritual Care Consult Needed: No Social Work Consult Needed: No Merchant navy officerAdvance Directives (For Healthcare) Does patient have an advance directive?: No Would patient like information on creating an advanced directive?: Yes English as a second language teacher- Educational materials given    Additional Information 1:1 In Past 12 Months?: No CIRT Risk: No Elopement Risk: No Does patient have medical clearance?: No     Disposition:  Disposition Initial Assessment Completed for this Encounter: Yes Disposition of Patient: Other dispositions (observation per Dr. Jannifer FranklinAkintayo) Other disposition(s): Other (Comment)  On Site Evaluation by:   Reviewed with Physician:    Arjan Strohm 05/20/2015 11:30 AM

## 2015-05-20 NOTE — ED Notes (Signed)
[facility] Called for transport to [facility] 

## 2015-05-20 NOTE — ED Notes (Signed)
Pt was found to be sleeping in waiting room between 6:45 and 8:30 am. Pt was asked by security to leave lobby, but soon returned. Pt was advised that he needed to leave the lobby if he was not being seen. Pt sts that he checked in but no one ever saw him. Pt chart was reviewed and was found that pt was called for traige and never answered and then was d/c from system. Pt sts that he must have been asleep, even though he is wanting to be seen for insomnia. Along with security presence, pt was advised that he could leave the premises. Pt made the decision to check back in for insomnia eval. .

## 2015-05-21 ENCOUNTER — Encounter (HOSPITAL_COMMUNITY): Payer: Self-pay | Admitting: Registered Nurse

## 2015-05-21 DIAGNOSIS — F15150 Other stimulant abuse with stimulant-induced psychotic disorder with delusions: Secondary | ICD-10-CM | POA: Diagnosis present

## 2015-05-21 DIAGNOSIS — Z809 Family history of malignant neoplasm, unspecified: Secondary | ICD-10-CM | POA: Diagnosis not present

## 2015-05-21 DIAGNOSIS — J45909 Unspecified asthma, uncomplicated: Secondary | ICD-10-CM | POA: Diagnosis present

## 2015-05-21 DIAGNOSIS — F11150 Opioid abuse with opioid-induced psychotic disorder with delusions: Secondary | ICD-10-CM | POA: Diagnosis not present

## 2015-05-21 DIAGNOSIS — F329 Major depressive disorder, single episode, unspecified: Secondary | ICD-10-CM | POA: Diagnosis not present

## 2015-05-21 DIAGNOSIS — Z820 Family history of epilepsy and other diseases of the nervous system: Secondary | ICD-10-CM | POA: Diagnosis not present

## 2015-05-21 DIAGNOSIS — M549 Dorsalgia, unspecified: Secondary | ICD-10-CM | POA: Diagnosis present

## 2015-05-21 DIAGNOSIS — F17210 Nicotine dependence, cigarettes, uncomplicated: Secondary | ICD-10-CM | POA: Diagnosis present

## 2015-05-21 DIAGNOSIS — G4700 Insomnia, unspecified: Secondary | ICD-10-CM | POA: Diagnosis not present

## 2015-05-21 DIAGNOSIS — F22 Delusional disorders: Secondary | ICD-10-CM | POA: Diagnosis not present

## 2015-05-21 DIAGNOSIS — F1190 Opioid use, unspecified, uncomplicated: Secondary | ICD-10-CM | POA: Diagnosis not present

## 2015-05-21 DIAGNOSIS — F19950 Other psychoactive substance use, unspecified with psychoactive substance-induced psychotic disorder with delusions: Secondary | ICD-10-CM | POA: Diagnosis not present

## 2015-05-21 DIAGNOSIS — Z23 Encounter for immunization: Secondary | ICD-10-CM | POA: Diagnosis not present

## 2015-05-21 DIAGNOSIS — F1590 Other stimulant use, unspecified, uncomplicated: Secondary | ICD-10-CM | POA: Diagnosis not present

## 2015-05-21 DIAGNOSIS — F1994 Other psychoactive substance use, unspecified with psychoactive substance-induced mood disorder: Secondary | ICD-10-CM | POA: Diagnosis present

## 2015-05-21 DIAGNOSIS — G894 Chronic pain syndrome: Secondary | ICD-10-CM | POA: Diagnosis not present

## 2015-05-21 DIAGNOSIS — F410 Panic disorder [episodic paroxysmal anxiety] without agoraphobia: Secondary | ICD-10-CM | POA: Diagnosis not present

## 2015-05-21 MED ORDER — HYDROXYZINE HCL 25 MG PO TABS: 25.0000 mg | ORAL_TABLET | Freq: Three times a day (TID) | ORAL | Status: DC | PRN

## 2015-05-21 MED ORDER — OLANZAPINE 5 MG PO TBDP
5.0000 mg | ORAL_TABLET | Freq: Every day | ORAL | Status: DC
  Administered 2015-05-21: 5 mg via ORAL
  Filled 2015-05-21 (×3): qty 1

## 2015-05-21 MED ORDER — OLANZAPINE 5 MG PO TBDP: 5.0000 mg | ORAL_TABLET | Freq: Every day | ORAL | Status: DC

## 2015-05-21 MED ORDER — TRAZODONE HCL 50 MG PO TABS
50.0000 mg | ORAL_TABLET | Freq: Every evening | ORAL | Status: DC | PRN
  Administered 2015-05-24: 50 mg via ORAL
  Filled 2015-05-21 (×2): qty 1

## 2015-05-21 MED ORDER — GABAPENTIN 300 MG PO CAPS
600.0000 mg | ORAL_CAPSULE | Freq: Two times a day (BID) | ORAL | Status: DC
  Administered 2015-05-22: 600 mg via ORAL
  Filled 2015-05-21 (×6): qty 2

## 2015-05-21 MED ORDER — NICOTINE 21 MG/24HR TD PT24
21.0000 mg | MEDICATED_PATCH | Freq: Every day | TRANSDERMAL | Status: DC
  Administered 2015-05-22 – 2015-05-24 (×3): 21 mg via TRANSDERMAL
  Filled 2015-05-21 (×7): qty 1

## 2015-05-21 NOTE — Tx Team (Addendum)
Initial Interdisciplinary Treatment Plan   PATIENT STRESSORS: Marital or family conflict Paranoid delusions    PATIENT STRENGTHS: Ability for insight Communication skills Motivation for treatment/growth Physical Health Supportive family/friends   PROBLEM LIST: Problem List/Patient Goals Date to be addressed Date deferred Reason deferred Estimated date of resolution  "I don't know if I'm hallucinating or if she wants me to think I am" 05/21/2015      "I wasn't sleeping and took an Adderal."  05/21/2015     Tearful - SI per report 05/21/2015                                          DISCHARGE CRITERIA:  Improved stabilization in mood, thinking, and/or behavior Reduction of life-threatening or endangering symptoms to within safe limits Safe-care adequate arrangements made  PRELIMINARY DISCHARGE PLAN: Outpatient therapy Participate in family therapy Return to previous living arrangement  PATIENT/FAMIILY INVOLVEMENT: This treatment plan has been presented to and reviewed with the patient, Omar Clark . The patient and family have been given the opportunity to ask questions and make suggestions.  Aurora Maskwyman, Leonila Speranza E 05/21/2015, 7:05 PM
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
## 2015-05-21 NOTE — Progress Notes (Addendum)
D: Pt has appropriate affect and pleasant mood.  He reports his day was "so-so" and that his goal tonight is to "rest."  Pt's only complaint is that he thinks "somebody's after me."  Pt denies SI/HI, denies hallucinations at this time.  Pt reports chronic back pain of 6/10.  PRN Tylenol was offered, pt refused.  Pt has been visible in milieu interacting with peers and staff appropriately.  Pt attended evening group.   A: Introduced self to pt.  Actively listened to pt and offered support and encouragement.  Medication administered per order.   R: Pt is compliant with medications.  Pt verbally contracts for safety and reports that he will inform staff of needs and concerns.  Will continue to monitor and assess.

## 2015-05-21 NOTE — [facility] Group Notes (Signed)
[facility] LCSW Group Therapy  05/21/2015  10:15 AM  Type of Therapy:  Group Therapy  Participation Level:  Did not attend  Summary of Progress/Problems: Patients processed thoughts and feelings about up coming discharge. We then discussed what is a supportive framework? What does it look like feel like and how do I discern it from and unhealthy non-supportive network?  We then looked at visuals representing relationships as patients identified difficulties with boundaries, expectations, and poor choices within relationships    Carney Bernatherine C Odilia Damico, LCSW

## 2015-05-21 NOTE — Progress Notes (Signed)
Patient refused gabapentin this am, because he stated that he normally takes it at night and it makes him feel drunk during the day if he takes. Patient is calm and cooperative. Speech is clear and logical. He stated that he has back pain 4/10.  He denies SI, HI, and AVH.  Patient safety maintained through constant observation.  Support and encouragement given.   Patient is compliant with treatment, will continue to monitor.

## 2015-05-21 NOTE — Progress Notes (Signed)
Adult Psychoeducational Group Note  Date:  05/21/2015 Time:  9:36 PM  Group Topic/Focus:  Wrap-Up Group:   The focus of this group is to help patients review their daily goal of treatment and discuss progress on daily workbooks.  Participation Level:  Active  Participation Quality:  Attentive  Affect:  Appropriate  Cognitive:  Appropriate  Insight: Appropriate  Engagement in Group:  Engaged  Modes of Intervention:  Discussion  Additional Comments:  Pt rated his day a 3 out of 10. Pt goal for tomorrow is to talk with doctor and get the medication that he need.   Merlinda FrederickKeshia S Jannice Beitzel 05/21/2015, 9:36 PM
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
## 2015-05-21 NOTE — Progress Notes (Signed)
Patient met sleeping during the shift change and continued sleeping untill 1 am. Requested food - salad offered. Denied pain, SI, AH/VH at this time. Made no new complaint. Safety maintained by constant monitoring and observation. Will continue to monitor patient for safety and stability.    
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
## 2015-05-22 MED ORDER — HYDROXYZINE HCL 25 MG PO TABS: 25.0000 mg | ORAL_TABLET | Freq: Four times a day (QID) | ORAL | Status: DC | PRN

## 2015-05-22 MED ORDER — CLONIDINE HCL 0.1 MG PO TABS
0.1000 mg | ORAL_TABLET | ORAL | Status: DC
  Administered 2015-05-24 – 2015-05-25 (×3): 0.1 mg via ORAL
  Filled 2015-05-22 (×5): qty 1

## 2015-05-22 MED ORDER — LOPERAMIDE HCL 2 MG PO CAPS: 2.0000 mg | ORAL_CAPSULE | ORAL | Status: DC | PRN

## 2015-05-22 MED ORDER — GABAPENTIN 300 MG PO CAPS
300.0000 mg | ORAL_CAPSULE | Freq: Three times a day (TID) | ORAL | Status: DC
  Administered 2015-05-22 – 2015-05-24 (×6): 300 mg via ORAL
  Filled 2015-05-22 (×14): qty 1

## 2015-05-22 MED ORDER — NAPROXEN 500 MG PO TABS
500.0000 mg | ORAL_TABLET | Freq: Two times a day (BID) | ORAL | Status: DC | PRN
  Administered 2015-05-22 – 2015-05-24 (×3): 500 mg via ORAL
  Filled 2015-05-22 (×3): qty 1

## 2015-05-22 MED ORDER — DICYCLOMINE HCL 20 MG PO TABS: 20.0000 mg | ORAL_TABLET | Freq: Four times a day (QID) | ORAL | Status: DC | PRN

## 2015-05-22 MED ORDER — OLANZAPINE 10 MG PO TBDP
10.0000 mg | ORAL_TABLET | Freq: Every day | ORAL | Status: DC
Start: 2015-05-22 — End: 2015-05-25
  Administered 2015-05-22 – 2015-05-24 (×3): 10 mg via ORAL
  Filled 2015-05-22 (×5): qty 1

## 2015-05-22 MED ORDER — CLONIDINE HCL 0.1 MG PO TABS
0.1000 mg | ORAL_TABLET | Freq: Four times a day (QID) | ORAL | Status: AC
  Administered 2015-05-22 – 2015-05-23 (×5): 0.1 mg via ORAL
  Filled 2015-05-22 (×10): qty 1

## 2015-05-22 MED ORDER — METHOCARBAMOL 500 MG PO TABS: 500.0000 mg | ORAL_TABLET | Freq: Three times a day (TID) | ORAL | Status: DC | PRN

## 2015-05-22 MED ORDER — CLONIDINE HCL 0.1 MG PO TABS
0.1000 mg | ORAL_TABLET | Freq: Every day | ORAL | Status: DC
  Filled 2015-05-22: qty 1

## 2015-05-22 MED ORDER — ONDANSETRON 4 MG PO TBDP: 4.0000 mg | ORAL_TABLET | Freq: Four times a day (QID) | ORAL | Status: DC | PRN
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
## 2015-05-22 NOTE — Progress Notes (Signed)
D:Pt has been asleep in his room. Pt refused scheduled clonidine saying that he was not having withdrawal symptoms. Per MD, check vitals q 4 hrs while awake. A:Offered support, encouragement and 1:1 monitoring. R:Safety maintained on the unit.

## 2015-05-22 NOTE — Progress Notes (Signed)
Recreation Therapy Notes  Date: 03.20.2017 Time: 9:30am Location: 300 Hall Group Room   Group Topic: Stress Management  Goal Area(s) Addresses:  Patient will actively participate in stress management techniques presented during session.   Behavioral Response: Did not attend.    Sicilia Killough L Orphia Mctigue, LRT/CTRS        Fatema Rabe L 05/22/2015 2:02 PM 

## 2015-05-22 NOTE — Progress Notes (Signed)
Adult Psychoeducational Group Note  Date:  05/22/2015 Time:  10:30 PM  Group Topic/Focus:  Wrap-Up Group:   The focus of this group is to help patients review their daily goal of treatment and discuss progress on daily workbooks.  Participation Level:  Active  Participation Quality:  Attentive  Affect:  Appropriate  Cognitive:  Appropriate  Insight: Good  Engagement in Group:  Engaged  Modes of Intervention:  Discussion  Additional Comments:  Pt goal is to get some sleep.   Omar Clark 05/22/2015, 10:30 PM

## 2015-05-22 NOTE — BHH Counselor (Signed)
Adult Comprehensive Assessment  Patient ID: Omar Clark, male   DOB: 1980-05-21, 35 y.o.   MRN: [id number]  Information Source: Information source: Patient  Current Stressors:  Educational / Learning stressors: None reported Employment / Job issues: None reported Family Relationships: Pt feels that his wife is involved in a plan to hurt him due to suspicious behavior Financial / Lack of resources (include bankruptcy): None reported Housing / Lack of housing: Pt feels that he is unsafe in his home due to wife's involvement with men who have been lurking on his property Physical health (include injuries & life threatening diseases): chronic back pain and unexplained skin lesions Social relationships: None reported Substance abuse: Pt reports that he has just recently been using a friend's adderral to stay awake Bereavement / Loss: Mother passed away in Jul 27, 2001  Living/Environment/Situation:  Living Arrangements: Spouse/significant other, Children Living conditions (as described by patient or guardian): feels unsafe How long has patient lived in current situation?: 16 years  Family History:  Marital status: Married Number of Years Married: 10 What types of issues is patient dealing with in the relationship?: recently has been feeling unsafe as he feels wife is seeing someone else  Does patient have children?: Yes How many children?: 3 How is patient's relationship with their children?: 11, 9, and 3; good relationship with his sons  Childhood History:  By whom was/is the patient raised?: Mother, Father Description of patient's relationship with caregiver when they were a child: father was not around often; got along well with mother Patient's description of current relationship with people who raised him/her: mother passed away at age 18; father is more involved now How were you disciplined when you got in trouble as a child/adolescent?: spanked Does patient have siblings?: No Did  patient suffer any verbal/emotional/physical/sexual abuse as a child?: No Did patient suffer from severe childhood neglect?: No Has patient ever been sexually abused/assaulted/raped as an adolescent or adult?: No Was the patient ever a victim of a crime or a disaster?: No Witnessed domestic violence?: No Has patient been effected by domestic violence as an adult?: No  Education:  Highest grade of school patient has completed: 12th Currently a Consulting civil engineer?: No Learning disability?: No  Employment/Work Situation:   Employment situation: Employed Where is patient 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 How long has patient been employed?: 3-4 years Patient's job has been impacted by current illness: No What is the longest time patient has a held a job?: 16 years 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 Has patient ever been in the [employer]?: No Has patient ever served in combat?: No Did You Receive Any Psychiatric Treatment/Services While in Equities trader?: No Are There Guns or Other Weapons in Your Home?: No  Financial Resources:   Financial resources: Income from employment, Medicaid  Alcohol/Substance Abuse:   What has been your use of drugs/alcohol within the last 12 months?: Has been using Adderral recently to stay awake If attempted suicide, did drugs/alcohol play a role in this?: No Alcohol/Substance Abuse Treatment Hx: Denies past history Has alcohol/substance abuse ever caused legal problems?: No  Social Support System:   Forensic psychologist System: Poor Describe Community Support System: limited social support Type of faith/religion: None How does patient's faith help to cope with current illness?: n/a  Leisure/Recreation:   Leisure and Hobbies: spending time with kids  Strengths/Needs:   What things does the patient do well?: job, good father In what areas does patient struggle /  problems for patient: nothing  Discharge Plan:    Does patient have access to transportation?: Yes Will patient be returning to same living situation after discharge?:  (unsure at this time; may go to stay with father) Currently receiving community mental health services: No If no, would patient like referral for services when discharged?: Yes (What county?) [facility]([place] Does patient have financial barriers related to discharge medications?: No  Summary/Recommendations:     Patient is a 35 year old male with a diagnosis of substance-induced psychotic disorder with delusions. Pt presented to the hospital with complaints of insomnia and racing mind. Pt reports primary trigger(s) for admission was being fearful that his wife is involved with men who are after him. Patient will benefit from crisis stabilization, medication evaluation, group therapy and psycho education in addition to case management for discharge planning. At discharge it is recommended that Pt remain compliant with established discharge plan and continued treatment.    Elaina Hoopsarter, Ahnesty Finfrock M. 05/22/2015

## 2015-05-22 NOTE — B[facility] Group Notes (Signed)
[facility] LCSW Group Therapy  05/22/2015 1:15pm  Type of Therapy:  Group Therapy vercoming Obstacles  Pt did not attend, declined invitation.   Chad CordialLauren Carter, LCSWA 05/22/2015 3:07 PM

## 2015-05-22 NOTE — [facility] Group Notes (Signed)
[facility] LCSW Aftercare Discharge Planning Group Note  05/22/2015 8:45 AM  Pt did not attend, declined invitation.   Christella App Carter, LCSWA 05/22/2015 9:23 AM  

## 2015-05-22 NOTE — Tx Team (Signed)
Interdisciplinary Treatment Plan Update (Adult) Date: 05/22/2015   Date: 05/22/2015 9:51 AM  Progress in Treatment:  Attending groups: Pt is new to milieu, continuing to assess  Participating in groups: Pt is new to milieu, continuing to assess  Taking medication as prescribed: Yes  Tolerating medication: Yes  Family/Significant othe contact made: No, CSW assessing for appropriate contact Patient understands diagnosis: Continuing to assess Discussing patient identified problems/goals with staff: Yes  Medical problems stabilized or resolved: Yes  Denies suicidal/homicidal ideation: Yes Patient has not harmed self or Others: Yes   New problem(s) identified: None identified at this time.   Discharge Plan or Barriers: CSW will assess for appropriate discharge plan and relevant barriers.   Additional comments:  Patient and CSW reviewed pt's identified goals and treatment plan. Patient verbalized understanding and agreed to treatment plan. CSW reviewed [facility] "Discharge Process and Patient Involvement" Form. Pt verbalized understanding of information provided and signed form.   Reason for Continuation of Hospitalization:  Anxiety Medication stabilization Withdrawal symptoms Psychotic- Paranoid  Estimated length of stay: 3-5 days  Review of initial/current patient goals per problem list:   1.  Goal(s): Patient will participate in aftercare plan  Met:  No  Target date: 3-5 days from date of admission   As evidenced by: Patient will participate within aftercare plan AEB aftercare provider and housing plan at discharge being identified.  05/22/15: CSW to work with Pt to assess for appropriate discharge plan and faciliate appointments and referrals as needed prior to d/c.  3.  Goal(s): Patient will demonstrate decreased signs and symptoms of anxiety.  Met:  No  Target date: 3-5 days from date of admission   As evidenced by: Patient will utilize self rating of anxiety at 3 or below and  demonstrated decreased signs of anxiety, or be deemed stable for discharge by MD 05/22/15: Pt was admitted with increased levels of anxiety and is currently rating those symptoms highly. Pt will demonstrated decreased symptoms of anxiety and rate it at 3/10 prior to d/c.  5.  Goal(s): Patient will demonstrate decreased signs of psychosis  . Met:  No . Target date: 3-5 days from date of admission  . As evidenced by: Patient will demonstrate decreased frequency of AVH or return to baseline function   05/22/15: Pt observed to be paranoid with   somewhat bizarre behavior.   Attendees:  Patient:    Family:    Physician: Dr. Parke Poisson, MD  05/22/2015 9:51 AM  Nursing: Lars Pinks, RN Case manager  05/22/2015 9:51 AM  Clinical Social Worker Peri Maris, [place] 05/22/2015 9:51 AM  Other: Tilden Fossa, LCSWA 05/22/2015 9:51 AM  Clinical: Desma Paganini, RN; Grayland Ormond, RN 05/22/2015 9:51 AM  Other: , RN Charge Nurse 05/22/2015 9:51 AM  Other:     Peri Maris, [place] Social Work [phone]

## 2015-05-22 NOTE — Progress Notes (Signed)
1:1 note  D: Pt has depressed affect and mood.  He has been visible in the milieu, conversing with peers.  Pt attended evening group.  He reports his day was "all right" and that his goal was "getting rest, I got it."  Pt denies SI/HI, denies hallucinations, reports chronic back pain of 7/10.  Pt denies withdrawal symptoms at this time.   A: Medication administered per order.  Actively listened to pt and offered support and encouragement.  1:1 monitoring continues for safety per order.    R: Pt is compliant with medications.  He is safe on the unit.  Will continue to monitor and assess.

## 2015-05-22 NOTE — Progress Notes (Signed)
[facility] MD Progress Note  05/22/2015 12:18 PM Omar Clark  MRN:  [id number] Subjective:  Patient reports he is " feeling OK". He states he feels his wife has been trying to hurt him or murder him. He describes episodes " where she activated my phone so that people could follow me ", and states he was recently stalked by three individuals , and that his wife seemed to know about this before he told her . Also states that one of these people made a strange " cat sound ", and that at a later date his wife made the same sound , leading him to believe there is some connection between the two.  He showed me some areas of skin discoloration on his hand, and expressed concern he might have been injected with some substance while sleeping . States that these events have been occuring for several months . Denies medication side effects. Objective : I have discussed case with treatment team and have met with patient . As above, patient reports psychotic symptoms. Insight is limited.  Denies any prior history of mental illness . UDS positive for opiates and for stimulants - patient states opiates are prescribed and takes them regularly, and that he took " adderall " from a friend , " but only one time ".  Of note, he denies depression, states " I am just anxious ". He denies any suicidal ideations, denies any homicidal ideations, and specifically denies any violent or homicidal ideations towards his wife. Head CT scan done 10/16 unremarkable  Of note - as reviewed with Nursing staff, patient was found to have contraband in his person , after staff were informed patient had offered substances to a peer. Pills in patient's possession were identified by pharmacist as oxycontin and oxycodone  ( no stimulants )  I have discussed this concern with Nursing Staff, Nurse Administrator, Medical Director, Dr. Dwyane Dee .    Principal Problem: Substance-induced psychotic disorder with delusions ([place]) Diagnosis:   Patient  Active Problem List   Diagnosis Date Noted  . Paranoia ([place]) [F22] 05/21/2015  . Substance-induced psychotic disorder with delusions ([place]) [F19.950] 05/21/2015  . Substance induced mood disorder ([place]) [F19.94] 05/21/2015  . Insomnia [G47.00] 05/20/2015  . Chronic urticaria [L50.8] 04/14/2013  . Smoker [Z72.0] 03/10/2013  . RAD (reactive airway disease) [J45.909] 03/10/2013  . Opiate addiction ([place]) [F11.20] 11/29/2012  . Lumbar degenerative disc disease [M51.36] 11/29/2012  . Chronic pain syndrome [G89.4] 11/29/2012   Total Time spent with patient: 30 minutes    Past Medical History:  Past Medical History  Diagnosis Date  . Chronic back pain   . Depression     Past Surgical History  Procedure Laterality Date  . Open reduction internal fixation (orif) hand     Family History:  Family History  Problem Relation Age of Onset  . Cancer Mother   . Cancer Maternal Grandmother   . Cancer Maternal Grandfather   . Cancer Paternal Grandmother   . Alzheimer's disease Paternal Grandfather    Social History:  History  Alcohol Use No     History  Drug Use No    Social History   Social History  . Marital Status: Married    Spouse Name: N/A  . Number of Children: 3  . Years of Education: 83   Social History Main Topics  . Smoking status: Current Every Day Smoker -- 2.00 packs/day for 20 years    Types: Cigarettes  . Smokeless tobacco: Never Used  .  Alcohol Use: No  . Drug Use: No  . Sexual Activity: Not Asked   Other Topics Concern  . None   Social History Narrative   Additional Social History:   Sleep: Fair  Appetite:  Fair  Current Medications: Current Facility-Administered Medications  Medication Dose Route Frequency Provider Last Rate Last Dose  . acetaminophen (TYLENOL) tablet 650 mg  650 mg Oral Q6H PRN Kerrie Buffalo, NP      . alum & mag hydroxide-simeth (MAALOX/MYLANTA) 200-200-20 MG/5ML suspension 30 mL  30 mL Oral Q4H PRN Kerrie Buffalo, NP      .  dicyclomine (BENTYL) tablet 20 mg  20 mg Oral Q6H PRN Jenne Campus, MD      . gabapentin (NEURONTIN) capsule 600 mg  600 mg Oral BID Shuvon B Rankin, NP   600 mg at 05/22/15 0810  . hydrOXYzine (ATARAX/VISTARIL) tablet 25 mg  25 mg Oral TID PRN Shuvon B Rankin, NP      . loperamide (IMODIUM) capsule 2-4 mg  2-4 mg Oral PRN Jenne Campus, MD      . magnesium hydroxide (MILK OF MAGNESIA) suspension 30 mL  30 mL Oral Daily PRN Kerrie Buffalo, NP      . methocarbamol (ROBAXIN) tablet 500 mg  500 mg Oral Q8H PRN Jenne Campus, MD      . naproxen (NAPROSYN) tablet 500 mg  500 mg Oral BID PRN Myer Peer Cobos, MD      . nicotine (NICODERM CQ - dosed in mg/24 hours) patch 21 mg  21 mg Transdermal Daily Shuvon B Rankin, NP   21 mg at 05/22/15 0810  . OLANZapine zydis (ZYPREXA) disintegrating tablet 10 mg  10 mg Oral QHS Myer Peer Cobos, MD      . ondansetron (ZOFRAN-ODT) disintegrating tablet 4 mg  4 mg Oral Q6H PRN Jenne Campus, MD      . traZODone (DESYREL) tablet 50 mg  50 mg Oral QHS PRN Shuvon B Rankin, NP        Lab Results: No results found for this or any previous visit (from the past 48 hour(s)).  Blood Alcohol level:  Lab Results  Component Value Date   ETH <5 05/20/2015    Physical Findings: AIMS: Facial and Oral Movements Muscles of Facial Expression: None, normal Lips and Perioral Area: None, normal Jaw: None, normal Tongue: None, normal,Extremity Movements Upper (arms, wrists, hands, fingers): None, normal Lower (legs, knees, ankles, toes): None, normal, Trunk Movements Neck, shoulders, hips: None, normal, Overall Severity Severity of abnormal movements (highest score from questions above): None, normal Incapacitation due to abnormal movements: None, normal Patient's awareness of abnormal movements (rate only patient's report): No Awareness, Dental Status Current problems with teeth and/or dentures?: No Does patient usually wear dentures?: No  CIWA:    COWS:      Musculoskeletal: Strength & Muscle Tone: within normal limits Gait & Station: normal Patient leans: N/A  Psychiatric Specialty Exam: ROS denies headache, denies vomiting, describes back pain- chronic No cramps or aches, no  Diarrhea   Blood pressure 115/84, pulse 76, temperature 98.9 F (37.2 C), temperature source Oral, resp. rate 16, height '5\' 10"'  (1.778 m), weight 184 lb (83.462 kg), SpO2 96 %.Body mass index is 26.4 kg/(m^2).  General Appearance: Fairly Groomed  Engineer, water::  Good  Speech:  Normal Rate  Volume:  Normal  Mood:  Anxious  Affect:  Restricted  Thought Process:  Linear  Orientation:  Other:  fully alert and attentive,  not sedated or drowsy  Thought Content:  Delusions- paranoid ideations as above   Suicidal Thoughts:  No- denies any suicidal ideations, denies any self injurious ideations  Homicidal Thoughts:  No- denies any homicidal ideations, denies any violent or homicidal ideations towards his wife or anyone else   Memory:  recent and remote grossly intact   Judgement:  Impaired  Insight:  Lacking  Psychomotor Activity:  Normal- no current tremors, no diaphoresis, no acute distress or restlessness   Concentration:  Good  Recall:  Good  Fund of Knowledge:Good  Language: Good  Akathisia:  Negative  Handed:  Right  AIMS (if indicated):     Assets:  Desire for Improvement Resilience  ADL's:  Impaired  Cognition: WNL  Sleep:  Number of Hours: 4.75  Assessment - patient presents with paranoid delusions, as described above . Of note, states these have been occuring for months , but no prior history of psychosis or mental illness as per his report . Endorses recent Adderall abuse ( dose?) , but states not using regularly.  Of note, found by Nursing staff, due to a report he was  offering substances to peers , to have contraband - opiates .  This has been discussed with nursing staff, other psychiatrists on unit, and with Dr. Dwyane Dee, Medical Director. As patient  currently exhibiting psychotic symptoms will continue inpatient treatment . Started one to one for safety .  Treatment Plan Summary: Daily contact with patient to assess and evaluate symptoms and progress in treatment, Medication management, Plan inpatient admission and medications as below  One to one observation for safety  Continue Neurontin at 300 mgrs TID for pain, anxiety- hold if sedated Start Clonidine taper to minimize risk of opiate WDL .  Zyprexa Zydis 10 mgrs QHS for psychotic symptoms   Neita Garnet, MD 05/22/2015, 12:18 PM

## 2015-05-23 LAB — LIPID PANEL
CHOL/HDL RATIO: 4.7 ratio
CHOLESTEROL: 128 mg/dL (ref 0–200)
HDL: 27 mg/dL — AB (ref >40)
LDL Cholesterol: 85 mg/dL (ref 0–99)
Triglycerides: 79 mg/dL (ref <150)
VLDL: 16 mg/dL (ref 0–40)

## 2015-05-23 MED ORDER — HYDROXYZINE HCL 25 MG PO TABS: 25.0000 mg | ORAL_TABLET | Freq: Three times a day (TID) | ORAL | Status: DC | PRN

## 2015-05-23 MED ORDER — OLANZAPINE 10 MG PO TBDP: 10.0000 mg | ORAL_TABLET | Freq: Every day | ORAL | Status: DC

## 2015-05-23 MED ORDER — TRAZODONE HCL 50 MG PO TABS: 50.0000 mg | ORAL_TABLET | Freq: Every evening | ORAL | Status: DC | PRN

## 2015-05-23 MED ORDER — NICOTINE 21 MG/24HR TD PT24: 21.0000 mg | MEDICATED_PATCH | Freq: Every day | TRANSDERMAL | Status: DC

## 2015-05-23 MED ORDER — GABAPENTIN 300 MG PO CAPS: 300.0000 mg | ORAL_CAPSULE | Freq: Three times a day (TID) | ORAL | Status: DC

## 2015-05-23 NOTE — [facility] Group Notes (Signed)
[facility] Group Notes:  (Nursing/MHT/Case Management/Adjunct)  Date:  05/23/2015  Time:  10:34 AM  Type of Therapy:  Psychoeducational Skills  Participation Level:  Did Not Attend  Participation Quality:  N/A  Affect:  N/A  Cognitive:  N/A  Insight:  None  Engagement in Group:  None  Modes of Intervention:  Discussion and Education  Summary of Progress/Problems: Patient invited to group but did not attend.  

## 2015-05-23 NOTE — Progress Notes (Signed)
Patient ID: Omar Clark, male   DOB: Feb 12, 1981, 35 y.o.   MRN: [id number] [facility] MD Progress Note  05/23/2015 5:58 PM Omar Clark  MRN:  [id number] Subjective: Patient reports feeling upset and irritated. He states he has chronic pain, is prescribed opiates, and does not want to stop taking them at this time .  Objective : I have discussed case with treatment team and have met with patient . Patient on one to one  observation for safety after contraband was found in patient's possession ( oxycodone, oxycontin in  a  plastic bag ) and after another patient reported patient had offered her/given her pills . Today patient states " I brought them in because they are prescribed to me and I need to take them, and she asked me for some , I did not offer them to anyone ". Patient reporting he is hoping for discharge soon, but  after allowing staff to speak with his wife, and becoming more  aware of how worried she is about him and his symptoms, he stated " I will stay for 72 hours, that's all ". Initially quite irritable, angry, but not threatening, later presented in the hallway, less irritable, participated in milieu. Patient gave express consent for staff to speak with wife and with friend. .Report from wife is that patient has been exhibiting psychotic symptoms for several weeks, months, with worsening paranoia,  delusions, to include worries he is being stalked , followed, and  that there is someone in his couch / furniture .  Of note, patient denies any suicidal or homicidal ideations, and in particular denies any thoughts of violence towards his wife or family. States " I plans to move in with my dad, I will feel safer there ". He denies any hallucinations and does not appear internally preoccupied at this time. Currently on Clonidine detox- states he is fearful of feeling opiate WDL symptoms," I do not want to get sick". At this time not in any acute distress or discomfort , no vomiting, no  diarrhea endorsed .    Principal Problem: Substance-induced psychotic disorder with delusions ([place]) Diagnosis:   Patient Active Problem List   Diagnosis Date Noted  . Paranoia ([place]) [F22] 05/21/2015  . Substance-induced psychotic disorder with delusions ([place]) [F19.950] 05/21/2015  . Substance induced mood disorder ([place]) [F19.94] 05/21/2015  . Insomnia [G47.00] 05/20/2015  . Chronic urticaria [L50.8] 04/14/2013  . Smoker [Z72.0] 03/10/2013  . RAD (reactive airway disease) [J45.909] 03/10/2013  . Opiate addiction ([place]) [F11.20] 11/29/2012  . Lumbar degenerative disc disease [M51.36] 11/29/2012  . Chronic pain syndrome [G89.4] 11/29/2012   Total Time spent with patient: 30 minutes    Past Medical History:  Past Medical History  Diagnosis Date  . Chronic back pain   . Depression     Past Surgical History  Procedure Laterality Date  . Open reduction internal fixation (orif) hand     Family History:  Family History  Problem Relation Age of Onset  . Cancer Mother   . Cancer Maternal Grandmother   . Cancer Maternal Grandfather   . Cancer Paternal Grandmother   . Alzheimer's disease Paternal Grandfather    Social History:  History  Alcohol Use No     History  Drug Use No    Social History   Social History  . Marital Status: Married    Spouse Name: N/A  . Number of Children: 3  . Years of Education: 44   Social History Main Topics  .  Smoking status: Current Every Day Smoker -- 2.00 packs/day for 20 years    Types: Cigarettes  . Smokeless tobacco: Never Used  . Alcohol Use: No  . Drug Use: No  . Sexual Activity: Not Asked   Other Topics Concern  . None   Social History Narrative   Additional Social History:   Sleep: improved   Appetite:  Fair  Current Medications: Current Facility-Administered Medications  Medication Dose Route Frequency Provider Last Rate Last Dose  . acetaminophen (TYLENOL) tablet 650 mg  650 mg Oral Q6H PRN Kerrie Buffalo, NP       . alum & mag hydroxide-simeth (MAALOX/MYLANTA) 200-200-20 MG/5ML suspension 30 mL  30 mL Oral Q4H PRN Kerrie Buffalo, NP      . cloNIDine (CATAPRES) tablet 0.1 mg  0.1 mg Oral QID Nicholaus Bloom, MD   0.1 mg at 05/23/15 1649   Followed by  . [START ON 05/24/2015] cloNIDine (CATAPRES) tablet 0.1 mg  0.1 mg Oral BH-qamhs Nicholaus Bloom, MD       Followed by  . [START ON 05/26/2015] cloNIDine (CATAPRES) tablet 0.1 mg  0.1 mg Oral QAC breakfast Nicholaus Bloom, MD      . dicyclomine (BENTYL) tablet 20 mg  20 mg Oral Q6H PRN Jenne Campus, MD      . gabapentin (NEURONTIN) capsule 300 mg  300 mg Oral TID Jenne Campus, MD   300 mg at 05/23/15 1649  . hydrOXYzine (ATARAX/VISTARIL) tablet 25 mg  25 mg Oral TID PRN Shuvon B Rankin, NP      . loperamide (IMODIUM) capsule 2-4 mg  2-4 mg Oral PRN Jenne Campus, MD      . magnesium hydroxide (MILK OF MAGNESIA) suspension 30 mL  30 mL Oral Daily PRN Kerrie Buffalo, NP      . methocarbamol (ROBAXIN) tablet 500 mg  500 mg Oral Q8H PRN Jenne Campus, MD      . naproxen (NAPROSYN) tablet 500 mg  500 mg Oral BID PRN Jenne Campus, MD   500 mg at 05/23/15 0820  . nicotine (NICODERM CQ - dosed in mg/24 hours) patch 21 mg  21 mg Transdermal Daily Shuvon B Rankin, NP   21 mg at 05/23/15 7035  . OLANZapine zydis (ZYPREXA) disintegrating tablet 10 mg  10 mg Oral QHS Jenne Campus, MD   10 mg at 05/22/15 2112  . ondansetron (ZOFRAN-ODT) disintegrating tablet 4 mg  4 mg Oral Q6H PRN Jenne Campus, MD      . traZODone (DESYREL) tablet 50 mg  50 mg Oral QHS PRN Shuvon B Rankin, NP        Lab Results:  Results for orders placed or performed during the hospital encounter of 05/20/15 (from the past 48 hour(s))  Lipid panel     Status: Abnormal   Collection Time: 05/23/15  6:32 AM  Result Value Ref Range   Cholesterol 128 0 - 200 mg/dL   Triglycerides 79 <150 mg/dL   HDL 27 (L) >40 mg/dL   Total CHOL/HDL Ratio 4.7 RATIO   VLDL 16 0 - 40 mg/dL   LDL  Cholesterol 85 0 - 99 mg/dL    Comment:        Total Cholesterol/HDL:CHD Risk Coronary Heart Disease Risk Table                     Men   Women  1/2 Average Risk   3.4   3.3  Average Risk       5.0   4.4  2 X Average Risk   9.6   7.1  3 X Average Risk  23.4   11.0        Use the calculated Patient Ratio above and the CHD Risk Table to determine the patient's CHD Risk.        ATP III CLASSIFICATION (LDL):  <100     mg/dL   Optimal  100-129  mg/dL   Near or Above                    Optimal  130-159  mg/dL   Borderline  160-189  mg/dL   High  >190     mg/dL   Very High Performed at [facility]     Blood Alcohol level:  Lab Results  Component Value Date   [facility] <5 05/20/2015    Physical Findings: AIMS: Facial and Oral Movements Muscles of Facial Expression: None, normal Lips and Perioral Area: None, normal Jaw: None, normal Tongue: None, normal,Extremity Movements Upper (arms, wrists, hands, fingers): None, normal Lower (legs, knees, ankles, toes): None, normal, Trunk Movements Neck, shoulders, hips: None, normal, Overall Severity Severity of abnormal movements (highest score from questions above): None, normal Incapacitation due to abnormal movements: None, normal Patient's awareness of abnormal movements (rate only patient's report): No Awareness, Dental Status Current problems with teeth and/or dentures?: No Does patient usually wear dentures?: No  CIWA:    COWS:  COWS Total Score: 2  Musculoskeletal: Strength & Muscle Tone: within normal limits Gait & Station: normal Patient leans: N/A  Psychiatric Specialty Exam: ROS denies headache, denies vomiting, describes back pain- chronic No cramps or aches, no  Diarrhea   Blood pressure 136/103, pulse 82, temperature 98.9 F (37.2 C), temperature source Oral, resp. rate 16, height '5\' 10"'  (1.778 m), weight 184 lb (83.462 kg), SpO2 96 %.Body mass index is 26.4 kg/(m^2).  General Appearance: Fairly Groomed  Chemical engineer::  Good  Speech:  Normal Rate  Volume:  Normal  Mood:  Irritable, dysphoric   Affect:  Irritable   Thought Process:  Linear  Orientation:  Other:  fully alert and attentive, not sedated or drowsy  Thought Content:  Delusions- paranoid ideations as above , denies hallucinations, and does not appear internally preoccupied   Suicidal Thoughts:  No- denies any suicidal ideations, denies any self injurious ideations  Homicidal Thoughts:  No- denies any homicidal ideations, denies any violent or homicidal ideations towards his wife or anyone else   Memory:  recent and remote grossly intact   Judgement:  Impaired  Insight:  Fair   Psychomotor Activity:  Normal- no current tremors, no diaphoresis, no acute distress or restlessness   Concentration:  Good  Recall:  Good  Fund of Knowledge:Good  Language: Good  Akathisia:  Negative  Handed:  Right  AIMS (if indicated):     Assets:  Desire for Improvement Resilience  ADL's:  Impaired  Cognition: WNL  Sleep:  Number of Hours: 6.75  Assessment -patient presenting with paranoid, psychotic symptoms. Limited insight, but today more amenable to discuss, acknowledge how his symptoms have affected his family, and today agreeing to further inpatient stay to work on psychiatric symptoms. Acknowledges  contraband, opiate misuse  as above, but states he does not feel motivated to stop opiates at this time due to history of chronic pain.   Treatment Plan Summary: Daily contact with patient to assess and evaluate  symptoms and progress in treatment, Medication management, Plan inpatient admission and medications as below  One to one observation for safety - see above  Continue Neurontin at 300 mgrs TID for pain, anxiety  Currently on Clonidine taper to minimize risk of opiate WDL .  Continue  Zyprexa Zydis 10 mgrs QHS for psychotic symptoms   Neita Garnet, MD 05/23/2015, 5:58 PM

## 2015-05-23 NOTE — Progress Notes (Signed)
1:1 note  D: Pt is resting in bed with his eyes closed.  Pt is snoring.  Respirations are even.  No distress noted.    A: 1:1 monitoring continues per order.    R: Pt is safe on the unit.  Will continue to monitor and assess.

## 2015-05-23 NOTE — Progress Notes (Signed)
Patient ID: Omar Clark, male   DOB: 1981/02/16, 35 y.o.   MRN: [id number]  DAR: Pt. Denies SI/HI and A/V Hallucinations. He reports sleep is good, appetite is good, energy level is normal, and concentration is good. He rates depression, anxiety, and hopelessness 5/10.  Patient does report back pain that is chronic in nature and received PRN Naproxen for this. He reports mild withdrawal symptoms although minimal and irritable when writer asks questions. Support and encouragement provided to the patient. Scheduled medications administered to patient per physician's orders Patient is isolative and remains in his room unless on the phone or watching television Q15 minute checks are maintained for safety and 1:1 continued. Patient was transferred to 500 hall and Marylene LandJim RN received report and assumes care.

## 2015-05-23 NOTE — Progress Notes (Signed)
Patient ID: Omar Clark, male   DOB: 22-Jul-1980, 35 y.o.   MRN: [id number] Nurse Note --  1600 hrs., 05/23/15   ---- D---   Pt. Remains on 1:1 as ordered due to pt. Having contraband on unit.  Pt. Maintains an irritable, labile affect and refused to communicate with staff.   Pt. Is now in his room  With sitter  At hand.  any attempt by staff to have conversation is met with hostility.  Pt. makes no complaints of pain or dis-comfort and any needs are taken care of promptly.  --- A ---   Support and 1:1 observation  --- R --  Pt. Remains safe  On unit

## 2015-05-23 NOTE — [facility] Group Notes (Signed)
[facility] LCSW Group Therapy 05/23/2015 1:15 PM  Type of Therapy: Group Therapy- Feelings about Diagnosis  Pt did not attend, declined invitation.   Chad CordialLauren Carter, LCSWA 05/23/2015 8:57 PM

## 2015-05-23 NOTE — Progress Notes (Signed)
1:1 note  D: Pt is resting in bed with his eyes closed.  Pt is snoring.  No distress noted.    A: 1:1 monitoring continues per order.    R: Pt is safe on the unit.  Will continue to monitor and assess.

## 2015-05-23 NOTE — Discharge Summary (Signed)
Physician Discharge Summary Note  Patient:  Omar Clark is an 35 y.o., male MRN:  [id number] DOB:  [birth year] Patient phone:  [phone] (home)  Patient address:   [address] [place] [address],  Total Time spent with patient: Greater than 30 minutes  Date of Admission:  05/20/2015 Date of Discharge: 05-25-15  Reason for Admission: Delusional thoughts  Principal Problem: Substance-induced psychotic disorder with delusions [facility]) Discharge Diagnoses: Patient Active Problem List   Diagnosis Date Noted  . Stimulant use disorder (HCC) [F15.90] 05/24/2015  . Opioid use disorder, moderate, in controlled environment [F11.90] 05/24/2015  . Substance-induced psychotic disorder with delusions (HCC) [F19.950] 05/21/2015  . Chronic urticaria [L50.8] 04/14/2013  . Smoker [Z72.0] 03/10/2013  . RAD (reactive airway disease) [J45.909] 03/10/2013  . Opiate addiction (HCC) [F11.20] 11/29/2012  . Lumbar degenerative disc disease [M51.36] 11/29/2012  . Chronic pain syndrome [G89.4] 11/29/2012   Past Psychiatric History: Opioid use disorder  Past Medical History:  Past Medical History  Diagnosis Date  . Chronic back pain   . Depression     Past Surgical History  Procedure Laterality Date  . Open reduction internal fixation (orif) hand     Family History:  Family History  Problem Relation Age of Onset  . Cancer Mother   . Cancer Maternal Grandmother   . Cancer Maternal Grandfather   . Cancer Paternal Grandmother   . Alzheimer's disease Paternal Grandfather    Family Psychiatric  History: See H&P  Social History:  History  Alcohol Use No     History  Drug Use No    Social History   Social History  . Marital Status: Married    Spouse Name: N/A  . Number of Children: 3  . Years of Education: 24   Social History Main Topics  . Smoking status: Current Every Day Smoker -- 2.00 packs/day for 20 years    Types: Cigarettes  . Smokeless tobacco: Never Used  . Alcohol  Use: No  . Drug Use: No  . Sexual Activity: Not Asked   Other Topics Concern  . None   Social History Narrative   Hospital Course: Omar Clark is a 35 yr old white male admitted to [facility] Observation Unit after his presentation to [facility] with complaints of insomnia related to racing mind keeping him awake. During interview patient reports that he feels like his wife is trying to kill him. Reports that this feeling started to increase in the last three months when odd things started happening "I wake up with blisters or sores on my skin that wasn't there when I went to bed. Hearing noises out side of my house like a human trying to sound like a cat; She'll say stuff like something is going to happen."  Braeson was admitted to the [facility] adult unit with his UDS test results positive for opioid & Amphetamine. Although, not actively suicidal, he was presenting with what appeared as delusional thinking. [name] was also presenting with psychosis, possibly substance induced. He was in need of opioid detox as well as mood stabilization treatments. After admission assessment/evaluation, his presenting symptoms were detected & identified. The medication regimen targeting those symptoms were discussed & initiated. He received Clonidine detoxification treatment protocols to combat the withdrawal symptoms of opioid. He was also medicated & discharged on; Olanzapine Zydis 10 mg disintegrating tablets for mood control, Gabapentin 300 mg for agitation, Hydroxyzine 25 mg prn for anxiety & Trazodone 50 mg for insomnia. He was also enrolled &  participated in the group counseling sessions being offered & held on this unit. He learned coping skills that should help him further to cope better & manage his depression/substance abuse issues after discharge.   During the course of his hospitalization, Omar Clark was caught distributing some controlled substances (Opioid) that he managed to smuggle inside the unit hidden  inside of a role of toilet paper. This type of behavior is unacceptable & warrants an immediate discharge from the hospital per the hospital rules & protocols. But, Albino was not discharged. He was rather transferred to another hall this a locked unit to assure adequate monitoring while he is receiving the mental health treatment he mostly needed. [name]'s treatment progressed without incident from there on till his discharge date.  [name] has completed detox treatment & his mood is now stable. This is evidenced by his reports of improved mood, absence of suicidal ideations, psychosis & or substance withdrawal symptoms. He is currently being discharged to continue further substance abuse treatment, psychiatric treatment & medication management as noted below. He is provided with all the pertinent information needed to make this appointment without problems.  Upon discharge, Omar Clark adamantly denies any SIHI, AVH, delusional thoughts, paranoia or substance withdrawal symptoms. He is provided with a 14 days worth, supply samples of his [facility] discharge medications. He left [facility] with all personal belongings in no apparent distress. Transportation per family.   Physical Findings: AIMS: Facial and Oral Movements Muscles of Facial Expression: None, normal Lips and Perioral Area: None, normal Jaw: None, normal Tongue: None, normal,Extremity Movements Upper (arms, wrists, hands, fingers): None, normal Lower (legs, knees, ankles, toes): None, normal, Trunk Movements Neck, shoulders, hips: None, normal, Overall Severity Severity of abnormal movements (highest score from questions above): None, normal Incapacitation due to abnormal movements: None, normal Patient's awareness of abnormal movements (rate only patient's report): No Awareness, Dental Status Current problems with teeth and/or dentures?: No Does patient usually wear dentures?: No  CIWA:    COWS:  COWS Total Score: 1  Musculoskeletal: Strength  & Muscle Tone: within normal limits Gait & Station: normal Patient leans: N/A  Psychiatric Specialty Exam: Review of Systems  Constitutional: Negative.   HENT: Negative.   Eyes: Negative.   Respiratory: Negative.   Cardiovascular: Negative.   Gastrointestinal: Negative.   Genitourinary: Negative.   Musculoskeletal: Negative.   Skin: Negative.   Neurological: Negative.   Endo/Heme/Allergies: Negative.   Psychiatric/Behavioral: Positive for depression, hallucinations (Hx. Delusional thoughts) and substance abuse (hx, Opioid addiction). Negative for suicidal ideas and memory loss. The patient is not nervous/anxious and does not have insomnia.     Blood pressure 126/96, pulse 85, temperature 97.7 F (36.5 C), temperature source Oral, resp. rate 18, height  (1.778 m), weight 83.462 kg (184 lb), SpO2 96 %.Body mass index is 26.4 kg/(m^2).  See md's SRA   Have you used any form of tobacco in the last 30 days? (Cigarettes, Smokeless Tobacco, Cigars, and/or Pipes): Yes  Has this patient used any form of tobacco in the last 30 days? (Cigarettes, Smokeless Tobacco, Cigars, and/or Pipes) Yes, Yes, A prescription for an FDA-approved tobacco cessation medication was offered at discharge and the patient refused  Blood Alcohol level:  Lab Results  Component Value Date   [facility] <5 05/20/2015   Metabolic Disorder Labs:  Lab Results  Component Value Date   HGBA1C 5.9* 05/23/2015   MPG 123 05/23/2015   Lab Results  Component Value Date   PROLACTIN 20.6* 05/23/2015   Lab  Results  Component Value Date   CHOL 128 05/23/2015   TRIG 79 05/23/2015   HDL 27* 05/23/2015   CHOLHDL 4.7 05/23/2015   VLDL 16 05/23/2015   LDLCALC 85 05/23/2015    See Psychiatric Specialty Exam and Suicide Risk Assessment completed by Attending Physician prior to discharge.  Discharge destination:  Home  Is patient on multiple antipsychotic therapies at discharge:  No   Has Patient had three or more failed  trials of antipsychotic monotherapy by history:  No  Recommended Plan for Multiple Antipsychotic Therapies: NA    Medication List    STOP taking these medications        azithromycin 250 MG tablet  Commonly known as:  ZITHROMAX     ibuprofen 800 MG tablet  Commonly known as:  ADVIL,MOTRIN     omeprazole 20 MG capsule  Commonly known as:  PRILOSEC     oxycodone 30 MG immediate release tablet  Commonly known as:  ROXICODONE     oxyCODONE 80 mg 12 hr tablet  Commonly known as:  OXYCONTIN     predniSONE 20 MG tablet  Commonly known as:  DELTASONE      TAKE these medications      Indication   gabapentin 300 MG capsule  Commonly known as:  NEURONTIN  Take 1 capsule (300 mg total) by mouth 4 (four) times daily. For agitation   Indication:  Aggressive Behavior, Agitation     hydrOXYzine 25 MG tablet  Commonly known as:  ATARAX/VISTARIL  Take 1 tablet (25 mg total) by mouth 3 (three) times daily as needed for anxiety.   Indication:  Anxiety     nicotine 21 mg/24hr patch  Commonly known as:  NICODERM CQ - dosed in mg/24 hours  Place 1 patch (21 mg total) onto the skin daily. For nicotine addiction   Indication:  Nicotine Addiction     OLANZapine zydis 10 MG disintegrating tablet  Commonly known as:  ZYPREXA  Take 1 tablet (10 mg total) by mouth at bedtime. For mood control   Indication:  Mood control     traZODone 50 MG tablet  Commonly known as:  DESYREL  Take 1 tablet (50 mg total) by mouth at bedtime as needed for sleep.   Indication:  Trouble Sleeping       Follow-up Information    Follow up with [facility] On 05/31/2015.   Why:  Next Wednesday at 10:30 to be opened for services and to see the Dr   Benay Pillowontact information:   [address]  [place] [phone]    Follow-up recommendations: Activity:  As tolerated Diet: As recommended by your primary care doctor. Keep all scheduled follow-up appointments as recommended.   Comments:  Take all your medications as prescribed by your mental healthcare provider. Report any adverse effects and or reactions from your medicines to your outpatient provider promptly. Patient is instructed and cautioned to not engage in alcohol and or illegal drug use while on prescription medicines. In the event of worsening symptoms, patient is instructed to call the crisis hotline, 911 and or go to the nearest ED for appropriate evaluation and treatment of symptoms. Follow-up with your primary care provider for your other medical issues, concerns and or health care needs.   Signed: Sanjuana KavaNwoko, Agnes I, NP, PMHNP-BC 05/25/2015, 4:26 PM

## 2015-05-23 NOTE — Progress Notes (Signed)
Patient ID: Omar Clark, male   DOB: 1980-05-15, 35 y.o.   MRN: [id number]  1:1 Nursing Note-  Patient sitting in the dayroom watching television at this time. Patient is calm and respirations are even and unlabored. 1:1 is maintained and Q15 minute safety checks maintained as well.

## 2015-05-23 NOTE — [facility] Suicide Risk Assessment (Signed)
[facility] INPATIENT:  Family/Significant Other Suicide Prevention Education  Suicide Prevention Education:  Education Completed; Omar Clark, Pt's wife [phone],  has been identified by the patient as the family member/significant other with whom the patient will be residing, and identified as the person(s) who will aid the patient in the event of a mental health crisis (suicidal ideations/suicide attempt).  With written consent from the patient, the family member/significant other has been provided the following suicide prevention education, prior to the and/or following the discharge of the patient.  The suicide prevention education provided includes the following:  Suicide risk factors  Suicide prevention and interventions  National Suicide Hotline telephone number  [facility] assessment telephone number  [facility] Assistance 911  [facility] and/or Residential Mobile Crisis Unit telephone number  Request made of family/significant other to:  Remove weapons (e.g., guns, rifles, knives), all items previously/currently identified as safety concern.    Remove drugs/medications (over-the-counter, prescriptions, illicit drugs), all items previously/currently identified as a safety concern.  The family member/significant other verbalizes understanding of the suicide prevention education information provided.  The family member/significant other agrees to remove the items of safety concern listed above.  CSW discussed progression of Pt's delusions over the last six months. Pt's wife was upset that discharge was a possibility as she was hoping that Pt would engage in treatment. Pt's wife expressed concern for Pt's safety due to the engrained nature of the delusions that she and others were trying to kill him.   Elaina HoopsCarter, Omar Clark 05/23/2015, 8:41 PM

## 2015-05-23 NOTE — Progress Notes (Signed)
1 - 1 note D. Pt in room at this time and appears to be resting comfortably. Respirations even and unlabored and pt does not appear to be in any acute distress at this time. A. 1 - 1 maintained at this time. R. Pt safe at present, will continue to monitor.

## 2015-05-24 DIAGNOSIS — F1590 Other stimulant use, unspecified, uncomplicated: Secondary | ICD-10-CM

## 2015-05-24 DIAGNOSIS — F1120 Opioid dependence, uncomplicated: Secondary | ICD-10-CM | POA: Diagnosis present

## 2015-05-24 DIAGNOSIS — F1190 Opioid use, unspecified, uncomplicated: Secondary | ICD-10-CM

## 2015-05-24 LAB — HEMOGLOBIN A1C
HEMOGLOBIN A1C: 5.9 % — AB (ref 4.8–5.6)
MEAN PLASMA GLUCOSE: 123 mg/dL

## 2015-05-24 LAB — PROLACTIN: PROLACTIN: 20.6 ng/mL — AB (ref 4.0–15.2)

## 2015-05-24 MED ORDER — GABAPENTIN 300 MG PO CAPS
300.0000 mg | ORAL_CAPSULE | Freq: Every day | ORAL | Status: DC
  Administered 2015-05-24: 300 mg via ORAL
  Filled 2015-05-24 (×2): qty 1

## 2015-05-24 MED ORDER — GABAPENTIN 300 MG PO CAPS
300.0000 mg | ORAL_CAPSULE | ORAL | Status: DC
  Administered 2015-05-24 – 2015-05-25 (×2): 300 mg via ORAL
  Filled 2015-05-24 (×5): qty 1

## 2015-05-24 MED ORDER — LIDOCAINE 5 % EX PTCH
2.0000 | MEDICATED_PATCH | Freq: Every day | CUTANEOUS | Status: DC
Start: 2015-05-24 — End: 2015-05-25
  Administered 2015-05-24 – 2015-05-25 (×2): 2 via TRANSDERMAL
  Filled 2015-05-24 (×4): qty 2

## 2015-05-24 NOTE — Progress Notes (Signed)
1:1 Note: Patient maintained on constant supervision for safety.  Patient is angry and irritable regarding his pain management therapy.  Patient continues to request for OxyContin and Oxycodone.  Refused other pain management.  Medication given as prescribed.  Routine safety checks maintained.  Denies auditory and visual hallucination.

## 2015-05-24 NOTE — Progress Notes (Signed)
[facility] Post 1:1 Observation Documentation  For the first (8) hours following discontinuation of 1:1 precautions, a progress note entry by nursing staff should be documented at least every 2 hours, reflecting the patient's behavior, condition, mood, and conversation.  Use the progress notes for additional entries.  Time 1:1 discontinued: 1100AM   Patient's Behavior:  Patient is sitting quietly in the dayroom with flat affect and depressed mood.  Patient's Condition:  Patient is calm with no complaint offered.  Patient's Conversation:  No interaction with staff or peers.  Omar Clark 05/24/2015, 2:53 PM

## 2015-05-24 NOTE — Progress Notes (Signed)
Patient ID: Omar Clark, male   DOB: 02-13-1981, 35 y.o.   MRN: [id number] [facility] MD Progress Note  05/24/2015 2:06 PM Omar Clark  MRN:  [id number] Subjective: Patient reports " I feel better today , I do not feel paranoid anymore, I do feel anxious and I am in pain.'    Objective : I have discussed case with treatment team and have met with patient .I have discussed pt with Dr.Cobbos and have reviewed his notes.  Patient was on one to one  observation for safety after contraband was found in patient's possession ( oxycodone, oxycontin in  a  plastic bag ) and after another patient reported patient had offered her/given her pills .   Pt today seen as less restless , less paranoid , reports he understands that his presenting sx could have been related to him abusing amphetamines. Pt reports he felt that his wife was out to get him and felt there was some breathing behind his back when he was sitting on his couch at home. Pt currently denies any AH/paranoia or sx similar to what he felt when he was home. Pt reports he is motivated to go to IOP or any kind of intensive out patient program inorder to get help .  Collateral information was obtained from wife Omar Clark at [phone] - who reported that she is tired of him just laying on the couch and having anger /irritability periodically and not contributing anything to help out. Per wife she would like her husband to be back on pain medications if that is prohibiting him from staying here at [facility] more. Per wife patient has a long hx of severe back pain, DDD as well as nerve injuries and has been to several doctors , has tried epidurals as well as steroid shots and the opioids are the only medications that work. Discussed with her that Wade who saw pt previously had made the evaluation that his psychosis may have been due to his opioids/amphetamines and have started him on a clonidine protocol. Discussed that he will not be restarted on  opioids while on the unit . However , after he is discharged he could return to his pain management provider and follow his recommendations.    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 Stimulant use disorder ([place]) [F15.90] 05/24/2015  . Opioid use disorder, moderate, in controlled environment [F11.90] 05/24/2015  . 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 Chronic urticaria [L50.8] 04/14/2013  . Smoker [Z72.0] 03/10/2013  . RAD (reactive airway disease) [J45.909] 03/10/2013  . Opiate addiction ([place]) [F11.20] 11/29/2012  . Lumbar degenerative disc disease [M51.36] 11/29/2012  . Chronic pain syndrome [G89.4] 11/29/2012   Total Time spent with patient: 30 minutes    Past Medical History:  Past Medical History  Diagnosis Date  . Chronic back pain   . Depression     Past Surgical History  Procedure Laterality Date  . Open reduction internal fixation (orif) hand     Family History:  Family History  Problem Relation Age of Onset  . Cancer Mother   . Cancer Maternal Grandmother   . Cancer Maternal Grandfather   . Cancer Paternal Grandmother   . Alzheimer's disease Paternal Grandfather    Social History:  History  Alcohol Use No     History  Drug Use No    Social  History   Social History  . Marital Status: Married    Spouse Name: N/A  . Number of Children: 3  . Years of Education: 83   Social History Main Topics  . Smoking status: Current Every Day Smoker -- 2.00 packs/day for 20 years    Types: Cigarettes  . Smokeless tobacco: Never Used  . Alcohol Use: No  . Drug Use: No  . Sexual Activity: Not Asked   Other Topics Concern  . None   Social History Narrative   Additional Social History:   Sleep: improved   Appetite:  Fair  Current Medications: Current Facility-Administered  Medications  Medication Dose Route Frequency Provider Last Rate Last Dose  . acetaminophen (TYLENOL) tablet 650 mg  650 mg Oral Q6H PRN Kerrie Buffalo, NP      . alum & mag hydroxide-simeth (MAALOX/MYLANTA) 200-200-20 MG/5ML suspension 30 mL  30 mL Oral Q4H PRN Kerrie Buffalo, NP      . cloNIDine (CATAPRES) tablet 0.1 mg  0.1 mg Oral BH-qamhs Nicholaus Bloom, MD   0.1 mg at 05/24/15 1610   Followed by  . [START ON 05/26/2015] cloNIDine (CATAPRES) tablet 0.1 mg  0.1 mg Oral QAC breakfast Nicholaus Bloom, MD      . dicyclomine (BENTYL) tablet 20 mg  20 mg Oral Q6H PRN Jenne Campus, MD      . gabapentin (NEURONTIN) capsule 300 mg  300 mg Oral TID Jenne Campus, MD   300 mg at 05/24/15 1116  . hydrOXYzine (ATARAX/VISTARIL) tablet 25 mg  25 mg Oral TID PRN Shuvon B Rankin, NP      . lidocaine (LIDODERM) 5 % 2 patch  2 patch Transdermal Daily Ursula Alert, MD   2 patch at 05/24/15 1313  . loperamide (IMODIUM) capsule 2-4 mg  2-4 mg Oral PRN Jenne Campus, MD      . magnesium hydroxide (MILK OF MAGNESIA) suspension 30 mL  30 mL Oral Daily PRN Kerrie Buffalo, NP      . methocarbamol (ROBAXIN) tablet 500 mg  500 mg Oral Q8H PRN Jenne Campus, MD      . naproxen (NAPROSYN) tablet 500 mg  500 mg Oral BID PRN Jenne Campus, MD   500 mg at 05/23/15 0820  . nicotine (NICODERM CQ - dosed in mg/24 hours) patch 21 mg  21 mg Transdermal Daily Shuvon B Rankin, NP   21 mg at 05/24/15 0832  . OLANZapine zydis (ZYPREXA) disintegrating tablet 10 mg  10 mg Oral QHS Jenne Campus, MD   10 mg at 05/23/15 2144  . ondansetron (ZOFRAN-ODT) disintegrating tablet 4 mg  4 mg Oral Q6H PRN Jenne Campus, MD      . traZODone (DESYREL) tablet 50 mg  50 mg Oral QHS PRN Shuvon B Rankin, NP        Lab Results:  Results for orders placed or performed during the hospital encounter of 05/20/15 (from the past 48 hour(s))  Hemoglobin A1c     Status: Abnormal   Collection Time: 05/23/15  6:32 AM  Result Value Ref  Range   Hgb A1c MFr Bld 5.9 (H) 4.8 - 5.6 %    Comment: (NOTE)         Pre-diabetes: 5.7 - 6.4         Diabetes: >6.4         Glycemic control for adults with diabetes: <7.0    Mean Plasma Glucose 123 mg/dL    Comment: (  NOTE) Performed At: [facility] [place], [place] [id number] Lindon Romp MD [id number] Performed at [facility]   Lipid panel     Status: Abnormal   Collection Time: 05/23/15  6:32 AM  Result Value Ref Range   Cholesterol 128 0 - 200 mg/dL   Triglycerides 79 <150 mg/dL   HDL 27 (L) >40 mg/dL   Total CHOL/HDL Ratio 4.7 RATIO   VLDL 16 0 - 40 mg/dL   LDL Cholesterol 85 0 - 99 mg/dL    Comment:        Total Cholesterol/HDL:CHD Risk Coronary Heart Disease Risk Table                     Men   Women  1/2 Average Risk   3.4   3.3  Average Risk       5.0   4.4  2 X Average Risk   9.6   7.1  3 X Average Risk  23.4   11.0        Use the calculated Patient Ratio above and the CHD Risk Table to determine the patient's CHD Risk.        ATP III CLASSIFICATION (LDL):  <100     mg/dL   Optimal  100-129  mg/dL   Near or Above                    Optimal  130-159  mg/dL   Borderline  160-189  mg/dL   High  >190     mg/dL   Very High Performed at [facility]   Prolactin     Status: Abnormal   Collection Time: 05/23/15  6:32 AM  Result Value Ref Range   Prolactin 20.6 (H) 4.0 - 15.2 ng/mL    Comment: (NOTE) Performed At: [facility] [place], [place] [id number] Lindon Romp MD [id number] Performed at [facility]     Blood Alcohol level:  Lab Results  Component Value Date   [facility] <5 05/20/2015    Physical Findings: AIMS: Facial and Oral Movements Muscles of Facial Expression: None, normal Lips and Perioral Area: None, normal Jaw: None, normal Tongue: None, normal,Extremity Movements Upper (arms, wrists, hands, fingers): None, normal Lower (legs,  knees, ankles, toes): None, normal, Trunk Movements Neck, shoulders, hips: None, normal, Overall Severity Severity of abnormal movements (highest score from questions above): None, normal Incapacitation due to abnormal movements: None, normal Patient's awareness of abnormal movements (rate only patient's report): No Awareness, Dental Status Current problems with teeth and/or dentures?: No Does patient usually wear dentures?: No  CIWA:    COWS:  COWS Total Score: 0  Musculoskeletal: Strength & Muscle Tone: within normal limits Gait & Station: normal Patient leans: N/A  Psychiatric Specialty Exam: Review of Systems  Musculoskeletal: Positive for back pain.  Psychiatric/Behavioral: The patient is nervous/anxious.   All other systems reviewed and are negative.  denies headache, denies vomiting, describes back pain- chronic No cramps or aches, no  Diarrhea   Blood pressure 106/56, pulse 70, temperature 97.9 F (36.6 C), temperature source Oral, resp. rate 20, height '5\' 10"'  (1.778 m), weight 83.462 kg (184 lb), SpO2 96 %.Body mass index is 26.4 kg/(m^2).  General Appearance: Fairly Groomed  Engineer, water::  Good  Speech:  Normal Rate  Volume:  Normal  Mood:  Irritable, dysphoric improving  Affect:  congruent  Thought Process:  Linear  Orientation:  Other:  fully alert and attentive, not sedated or drowsy  Thought Content:  Delusions improving  Suicidal Thoughts:  No- denies any suicidal ideations, denies any self injurious ideations  Homicidal Thoughts:  No- denies any homicidal ideations, denies any violent or homicidal ideations towards his wife or anyone else   Memory:  recent and remote grossly intact , immediate - fair  Judgement:  Fair  Insight:  Fair   Psychomotor Activity:  Normal- no current tremors, no diaphoresis, no acute distress or restlessness   Concentration:  Good  Recall:  Good  Fund of Knowledge:Good  Language: Good  Akathisia:  Negative  Handed:  Right  AIMS  (if indicated):     Assets:  Desire for Improvement Resilience  ADL's:  Impaired  Cognition: WNL  Sleep:  Number of Hours: 7   Assessment -patient presenting with psychosis , likely secondary to opioid abuse/amphetamine abuse , was found to be offering oxycodone pills that he had managed to bring to the unit ( hidden in his possession) on admission. Pt today seems to be less restless, less paranoid , is motivated to get in to an intensive out patient program. Will continue treatment.  Treatment Plan Summary: Daily contact with patient to assess and evaluate symptoms and progress in treatment, Medication management, Plan inpatient admission and medications as below  Will discontinue 1:1 precaution for safety. Will increase Neurontin at 300 mgrs po QID for pain, anxiety  Currently on Clonidine taper to minimize risk of opiate WDL .  Continue  Zyprexa Zydis 10 mgrs PO QHS for psychotic symptoms  Will offer lidocaine patch as well as apply ice prn for chronic pain. Will continue Naproxen 500 mg po bid prn , tylenol prn as scheduled. Continue Trazodone 50 mg po qhs prn for sleep. Will order EKG for qtc since pt is on zyprexa. CSW will continue working on disposition.   Coran Dipaola, MD 05/24/2015, 2:06 PM

## 2015-05-24 NOTE — Progress Notes (Signed)
1 - 1 note D. Pt in bed and resting comfortably at this time with eyes closed. Respirations even and unlabored and does not appear to be in any acute distress. A. 1 - 1 maintained at this time. R. Pt safe at present, will continue to monitor.

## 2015-05-24 NOTE — Progress Notes (Signed)
[facility] Post 1:1 Observation Documentation  For the first (8) hours following discontinuation of 1:1 precautions, a progress note entry by nursing staff should be documented at least every 2 hours, reflecting the patient's behavior, condition, mood, and conversation.  Use the progress notes for additional entries.  Time 1:1 discontinued:  1100AM  Patient's Behavior: Patient remained calm and quiet.  No issues noted or reported.   Patient's Condition:  Patient in the dayroom. No complain of pain or discomfort reported.  Patient's Conversation: minimal interaction with staff and peers.   Omar Clark 05/24/2015, 7:04 PM

## 2015-05-24 NOTE — Progress Notes (Signed)
[facility] Post 1:1 Observation Documentation  For the first (8) hours following discontinuation of 1:1 precautions, a progress note entry by nursing staff should be documented at least every 2 hours, reflecting the patient's behavior, condition, mood, and conversation.  Use the progress notes for additional entries.  Time 1:1 discontinued:  1100AM  Patient's Behavior:  Patient is angry and irritable.  Patient's Condition:  Patient complain about back pain but refused pain management when offered.  Patient sitting quietly in the dayroom with flat affect.  Patient's Conversation:  Minimal interaction with staff and peers.  Mickie Baillizabeth O Iwenekha 05/24/2015, 2:49 PM

## 2015-05-24 NOTE — Progress Notes (Signed)
1 - 1 note D. Pt in room and in bed at this time, resting comfortably with eyes closed. Respirations even and unlabored and pt does not appear in any acute distress at this time. Pt was up earlier in dayroom and did watch some TV but did not interact with staff or peers. Pt did allow blood pressure to be taken and did take bedtime medications without incident but when asked about pain he simply refused and stated "They're not giving me what I need." Pt stayed in dayroom until approximately 11pm before retiring back to his room. A. Support and encouragement provided, 1 -1  Maintained at this time. R. Safety maintained, will continue to monitor.

## 2015-05-24 NOTE — Progress Notes (Signed)
[facility] Post 1:1 Observation Documentation  For the first (8) hours following discontinuation of 1:1 precautions, a progress note entry by nursing staff should be documented at least every 2 hours, reflecting the patient's behavior, condition, mood, and conversation.  Use the progress notes for additional entries.  Time 1:1 discontinued:  1100AM  Patient's Behavior:  Patient is calm and in bed sleeping.  Patient's Condition:  Patient is stable.  No complaint offered.  Patient's Conversation: No interaction with staff and peers.   Mickie Baillizabeth O Iwenekha 05/24/2015, 5:02 PM

## 2015-05-24 NOTE — [facility] Group Notes (Signed)
[facility] LCSW Group Therapy  05/24/2015 1:50 PM  Type of Therapy: Group Therapy  Participation Level: Active  Participation Quality: Attentive  Affect: Flat  Cognitive: Oriented  Insight: Limited  Engagement in Therapy: Engaged  Modes of Intervention: Discussion and Socialization  Summary of Progress/Problems: Onalee HuaDavid from [employer] was here to tell his story of recovery and play his guitar.  Pt stayed for the entire group. Spent most of the time looking out of the window but presented with an appropriate affect.   Vito BackersLynn B. Beverely PaceBryant 05/24/2015 1:50 PM

## 2015-05-25 MED ORDER — GABAPENTIN 300 MG PO CAPS: 300.0000 mg | ORAL_CAPSULE | Freq: Four times a day (QID) | ORAL | Status: DC

## 2015-05-25 MED ORDER — LIDOCAINE 5 % EX PTCH: 2.0000 | MEDICATED_PATCH | Freq: Every day | CUTANEOUS | Status: DC

## 2015-05-25 NOTE — Progress Notes (Signed)
  [facility] Discharge [facility] :  Will you be returning to the same living situation after discharge:  Yes,  home At discharge, do you have transportation home?: Yes,  family Do you have the ability to pay for your medications: Yes,  MCD  Release of information consent forms completed and in the chart;  Patient's signature needed at discharge.  Patient to Follow up at: Follow-up Information    Follow up with [facility] On 05/31/2015.   Why:  Next Wednesday at 10:30 to be opened for services and to see the Dr   Benay Pillowontact information:   [address]  [place] [336] [phone]      Next level of care provider has access to [facility] Link:no  Safety Planning and Suicide Prevention discussed: Yes,  yes  Have you used any form of tobacco in the last 30 days? (Cigarettes, Smokeless Tobacco, Cigars, and/or Pipes): Yes  Has patient been referred to the Quitline?: Patient refused referral  Patient has been referred for addiction treatment: Yes  Ida Rogueorth, Claira Jeter B 05/25/2015, 9:27 AM

## 2015-05-25 NOTE — [facility] Suicide Risk Assessment (Signed)
[facility]   Principal Problem: Substance-induced psychotic disorder with delusions (HCC) stimulants, opioid Discharge Diagnoses:  Patient Active Problem List   Diagnosis Date Noted  . Stimulant use disorder (HCC) [F15.90] 05/24/2015  . Opioid use disorder, moderate, in controlled environment [F11.90] 05/24/2015  . Substance-induced psychotic disorder with delusions (HCC) [F19.950] 05/21/2015  . Chronic urticaria [L50.8] 04/14/2013  . Smoker [Z72.0] 03/10/2013  . RAD (reactive airway disease) [J45.909] 03/10/2013  . Opiate addiction (HCC) [F11.20] 11/29/2012  . Lumbar degenerative disc disease [M51.36] 11/29/2012  . Chronic pain syndrome [G89.4] 11/29/2012    Total Time spent with patient: 30 minutes  Musculoskeletal: Strength & Muscle Tone: within normal limits Gait & Station: normal Patient leans: N/A  Psychiatric Specialty Exam: Review of Systems  Psychiatric/Behavioral: Positive for substance abuse. Negative for depression. The patient is not nervous/anxious.   All other systems reviewed and are negative.   Blood pressure 126/96, pulse 85, temperature 97.7 F (36.5 C), temperature source Oral, resp. rate 18, height 5\' 10"  (1.778 m), weight 83.462 kg (184 lb), SpO2 96 %.Body mass index is 26.4 kg/(m^2).  General Appearance: Casual  Eye Contact::  Fair  Speech:  Clear and Coherent409  Volume:  Normal  Mood:  Euthymic  Affect:  Congruent  Thought Process:  Coherent  Orientation:  Full (Time, Place, and Person)  Thought Content:  WDL  Suicidal Thoughts:  No  Homicidal Thoughts:  No  Memory:  Immediate;   Fair Recent;   Fair Remote;   Fair  Judgement:  Fair  Insight:  Fair  Psychomotor Activity:  Normal  Concentration:  Fair  Recall:  Fis[employer]Fair  Fund of Knowledge:Fair  Language: Fair  Akathisia:  No  Handed:  Right  AIMS (if indicated):   0  Assets:  Desire for Improvement  Sleep:  Number of Hours: 6.75  Cognition: WNL  ADL's:  Intact    Mental Status Per Nursing Assessment::   On Admission:     Demographic Factors:  Male and Caucasian  Loss Factors: NA  Historical Factors: Impulsivity  Risk Reduction Factors:   Positive social support  Continued Clinical Symptoms:  Alcohol/Substance Abuse/Dependencies  Cognitive Features That Contribute To Risk:  Polarized thinking    Suicide Risk:  Minimal: No identifiable suicidal ideation.  Patients presenting with no risk factors but with morbid ruminations; may be classified as minimal risk based on the severity of the depressive symptoms  Follow-up Information    Follow up with [facility].   Contact information:   [address]  [place] [336] [phone]      Plan Of Care/Follow-up recommendations:  Activity:  no restrictions Diet:  regular Tests:  as needed Other:  follow up with after care  Kiah Vanalstine, MD 05/25/2015, 9:18 AM

## 2015-05-25 NOTE — Progress Notes (Signed)
Patient discharged home with prescription. Patient was stable and appreciative at that time. All papers and prescriptions were given and valuables returned. Verbal understanding expressed. Denies SI/HI and A/VH. Patient given opportunity to express concerns and ask questions.  

## 2015-05-25 NOTE — Progress Notes (Signed)
D: Pt presents irritable in affect and labile in mood. Pt had some moments where he was less irritable in interaction with Clinical research associatewriter. Pt was visible within the milieu but with minimal interactions with others. Pt denied any SI/HI/AVH.  A: Writer administered scheduled and prn medications to pt, per MD orders. Continued support and availability as needed was extended to this pt. Staff continues to monitor pt with q6015min checks.  R: No adverse drug reactions noted. Pt receptive to treatment. Pt remains safe at this time.

## 2015-05-25 NOTE — Tx Team (Signed)
Interdisciplinary Treatment Plan Update (Adult) Date: 05/25/2015   Date: 05/25/2015 9:31 AM  Progress in Treatment:  Attending groups: Yes Participating in groups:Yes Taking medication as prescribed: Yes  Tolerating medication: Yes  Family/Significant othe contact made: Yes Patient understands diagnosis: No  In denieal about extent of drug use Discussing patient identified problems/goals with staff: Yes  Medical problems stabilized or resolved: Yes  Denies suicidal/homicidal ideation: Yes Patient has not harmed self or Others: Yes   New problem(s) identified: None identified at this time.   Discharge Plan or Barriers: CSW will assess for appropriate discharge plan and relevant barriers.   Additional comments:  Patient and CSW reviewed pt's identified goals and treatment plan. Patient verbalized understanding and agreed to treatment plan. CSW reviewed [facility] "Discharge Process and Patient Involvement" Form. Pt verbalized understanding of information provided and signed form.   Reason for Continuation of Hospitalization:   Estimated length of stay: D/C today  Review of initial/current patient goals per problem list:   1.  Goal(s): Patient will participate in aftercare plan  Met:  Yes  Target date: 3-5 days from date of admission   As evidenced by: Patient will participate within aftercare plan AEB aftercare provider and housing plan at discharge being identified.  05/22/15: CSW to work with Pt to assess for appropriate discharge plan and faciliate appointments and referrals as needed prior to d/c. 05/25/15:  Return home, follow up outpt  3.  Goal(s): Patient will demonstrate decreased signs and symptoms of anxiety.  Met: Yes  Target date: 3-5 days from date of admission   As evidenced by: Patient will utilize self rating of anxiety at 3 or below and demonstrated decreased signs of anxiety, or be deemed stable for discharge by MD 05/22/15: Pt was admitted with increased levels of  anxiety and is currently rating those symptoms highly. Pt will demonstrated decreased symptoms of anxiety and rate it at 3/10 prior to d/c. 05/25/15:  Denies anxiety today.  5.  Goal(s): Patient will demonstrate decreased signs of psychosis  . Met:  Yes . Target date: 3-5 days from date of admission  . As evidenced by: Patient will demonstrate decreased frequency of AVH or return to baseline function   05/22/15: Pt observed to be paranoid with somewhat bizarre behavior.   05/25/15:  No signs nor symptoms of psychosis today   Attendees:  Patient:    Family:    Physician: Ursula Alert  05/25/2015 9:31 AM  Nursing: Phillis Haggis 05/25/2015 9:31 AM  [place],  05/25/2015 9:31 AM  Other: 05/25/2015 9:31 AM  Clinical:  05/25/2015 9:31 AM  Other: , RN Charge Nurse 05/25/2015 9:31 AM  Other:     Ripley Fraise 05/25/2015

## 2015-05-29 ENCOUNTER — Other Ambulatory Visit: Payer: Self-pay | Admitting: Psychiatry

## 2015-05-31 ENCOUNTER — Emergency Department (HOSPITAL_COMMUNITY)
Admission: EM | Admit: 2015-05-31 | Discharge: 2015-05-31 | Disposition: A | Discharge status: Home / Self Care | Payer: Medicaid Other | Attending: Emergency Medicine | Admitting: Emergency Medicine

## 2015-05-31 ENCOUNTER — Encounter (HOSPITAL_COMMUNITY): Payer: Self-pay

## 2015-05-31 DIAGNOSIS — G8929 Other chronic pain: Secondary | ICD-10-CM | POA: Diagnosis not present

## 2015-05-31 DIAGNOSIS — F17210 Nicotine dependence, cigarettes, uncomplicated: Secondary | ICD-10-CM | POA: Diagnosis not present

## 2015-05-31 DIAGNOSIS — G4700 Insomnia, unspecified: Secondary | ICD-10-CM | POA: Diagnosis not present

## 2015-05-31 DIAGNOSIS — R441 Visual hallucinations: Secondary | ICD-10-CM | POA: Diagnosis not present

## 2015-05-31 DIAGNOSIS — Z5321 Procedure and treatment not carried out due to patient leaving prior to being seen by health care provider: Secondary | ICD-10-CM | POA: Diagnosis not present

## 2015-05-31 DIAGNOSIS — R440 Auditory hallucinations: Secondary | ICD-10-CM | POA: Diagnosis present

## 2015-05-31 LAB — COMPREHENSIVE METABOLIC PANEL
ALK PHOS: 68 U/L (ref 38–126)
ALT: 18 U/L (ref 17–63)
AST: 15 U/L (ref 15–41)
Albumin: 4.3 g/dL (ref 3.5–5.0)
Anion gap: 9 (ref 5–15)
BILIRUBIN TOTAL: 0.6 mg/dL (ref 0.3–1.2)
BUN: 8 mg/dL (ref 6–20)
CALCIUM: 9.6 mg/dL (ref 8.9–10.3)
CHLORIDE: 105 mmol/L (ref 101–111)
CO2: 25 mmol/L (ref 22–32)
CREATININE: 0.92 mg/dL (ref 0.61–1.24)
GFR calc Af Amer: >60 mL/min (ref >60)
Glucose, Bld: 86 mg/dL (ref 65–99)
Potassium: 3.8 mmol/L (ref 3.5–5.1)
Sodium: 139 mmol/L (ref 135–145)
Total Protein: 7.5 g/dL (ref 6.5–8.1)

## 2015-05-31 LAB — CBC
HEMATOCRIT: 39.7 % (ref 39.0–52.0)
Hemoglobin: 14.3 g/dL (ref 13.0–17.0)
MCH: 30 pg (ref 26.0–34.0)
MCHC: 36 g/dL (ref 30.0–36.0)
MCV: 83.4 fL (ref 78.0–100.0)
Platelets: 307 10*3/uL (ref 150–400)
RBC: 4.76 MIL/uL (ref 4.22–5.81)
RDW: 14 % (ref 11.5–15.5)
WBC: 9.2 10*3/uL (ref 4.0–10.5)

## 2015-05-31 LAB — ETHANOL

## 2015-05-31 NOTE — ED Notes (Signed)
Pt. Made aware for the need of urine, pt. States, "I'm good." RN,Terrence made aware.

## 2015-05-31 NOTE — ED Notes (Signed)
Pt stated he wanted to leave AMA and asked for directions out of the department.

## 2015-05-31 NOTE — ED Provider Notes (Signed)
Patient left without being seen  1. Patient left without being seen      Melene Planan Sophee Mckimmy, DO 05/31/15 2134

## 2015-05-31 NOTE — ED Notes (Signed)
Pt here with hallucinations auditory and visual.  Pt sates no SI/HI.  Has been prescribed meds but unable to get them filled.  Voices do not command.  Hears breathing.  Sees faces in pictures.

## 2015-06-05 ENCOUNTER — Ambulatory Visit (INDEPENDENT_AMBULATORY_CARE_PROVIDER_SITE_OTHER): Payer: Self-pay | Admitting: Internal Medicine

## 2015-06-05 ENCOUNTER — Telehealth: Payer: Self-pay

## 2015-06-05 VITALS — BP 132/82 | HR 79 | Temp 98.0°F | Resp 17 | Ht 71.0 in | Wt 168.0 lb

## 2015-06-05 DIAGNOSIS — F19950 Other psychoactive substance use, unspecified with psychoactive substance-induced psychotic disorder with delusions: Secondary | ICD-10-CM

## 2015-06-05 DIAGNOSIS — F22 Delusional disorders: Secondary | ICD-10-CM

## 2015-06-05 DIAGNOSIS — F17200 Nicotine dependence, unspecified, uncomplicated: Secondary | ICD-10-CM

## 2015-06-05 DIAGNOSIS — G894 Chronic pain syndrome: Secondary | ICD-10-CM

## 2015-06-05 DIAGNOSIS — Z720 Tobacco use: Secondary | ICD-10-CM

## 2015-06-05 MED ORDER — OXYCODONE HCL 30 MG PO TABS: 45.0000 mg | ORAL_TABLET | ORAL | Status: DC | PRN

## 2015-06-05 MED ORDER — OXYCODONE HCL ER 80 MG PO T12A: 80.0000 mg | EXTENDED_RELEASE_TABLET | Freq: Two times a day (BID) | ORAL | Status: AC

## 2015-06-05 NOTE — Telephone Encounter (Signed)
Pt requesting Oxychodone,Ocycotin refills   Best phone number is [phone]

## 2015-06-05 NOTE — Patient Instructions (Signed)
     IF you received an x-ray today, you will receive an invoice from [facility]. Please contact [facility] at [phone] with questions or concerns regarding your invoice.   IF you received labwork today, you will receive an invoice from [facility]. Please contact [facility] at [phone] with questions or concerns regarding your invoice.   Our billing staff will not be able to assist you with questions regarding bills from these companies.  You will be contacted with the lab results as soon as they are available. The fastest way to get your results is to activate your My Chart account. Instructions are located on the last page of this paperwork. If you have not heard from us regarding the results in 2 weeks, please contact this office.      

## 2015-06-05 NOTE — Progress Notes (Signed)
Subjective:    Patient ID: Omar Clark, male    DOB: Sep 24, 1980, 35 y.o.   MRN: [id number] By signing my name below, I, Omar Clark, attest that this documentation has been prepared under the direction and in the presence of Omar Sia, MD.  Electronically Signed: Littie Clark, Medical Scribe. 06/05/2015. 6:21 PM.  HPI HPI Comments: Omar Clark is a 35 y.o. male with a history of chronic pain syndrome who presents [facility] the [facility] wanting to discuss lab results from recent behavioral health hospitalization. Also wants medication refill for chronic pain syndrome.   Initially referred to [place] 11/27/2012 because he was unhappy with his current pain management: HPI Back pain x 7 years. Currently in pain management at [facility]. States he has had extensive work up in the past by [employer] - diagnosed with disk disease in back pinching nerves. Denies any acute injury. Did play professional volleyball for years. Is frustrated with pain managements - all they do is write his prescriptions.  Taking oxycodone 30 mg every 4 hours (bottle says 6) and robaxin 750 mg at bedtime.  Wants to get out of pain clinic and off pain medication The plan was to reevaluate his back with MRI and then set up referral for orthopedics or neurosurgery based on the outcome. His insurance would not approve MRI. He was referred to Dr. Yevette Clark early 2015. Both things had to wait for him to receive approval through his PCP which still ahs not been accomplished.  PCP Dr Omar Clark in [place].  He was seen in the ED and admitted to [facility] on 3/18." Patient presents with insomnia and reports having auditory hallucinations. He states he does not feel safe going home due to feeling that his wife will hurt him. Denies SI/HI. VSS. Exam unremarkable. UDS positive for opiates and amphetamines. Patient endorses taking hydrocodone for his chronic back pain and reports he took his friend's  Adderall 2 days ago. Remaining labs unremarkable. Patient medically cleared. Consulted TTS. Behavioral Health recommend observation"  Hospital Course-note per NP Omar Clark//for Dr Omar Clark: ((Omar Clark is a 35 yr old white male admitted to [facility] Observation Unit after his presentation to [facility] with complaints of insomnia related to racing mind keeping him awake. During interview patient reports that he feels like his wife is trying to kill him. Reports that this feeling started to increase in the last three months when odd things started happening "I wake up with blisters or sores on my skin that wasn't there when I went to bed. Hearing noises out side of my house like a human trying to sound like a cat; She'll say stuff like something is going to happen."  Omar Clark was admitted to the [facility] adult unit with his UDS test results positive for opioid & Amphetamine. Although, not actively suicidal, he was presenting with what appeared as delusional thinking. Omar Clark was also presenting with psychosis, possibly substance induced. He was in need of opioid detox as well as mood stabilization treatments. After admission assessment/evaluation, his presenting symptoms were detected & identified. The medication regimen targeting those symptoms were discussed & initiated. He received Clonidine detoxification treatment protocols to combat the withdrawal symptoms of opioid. He was also medicated & discharged on; Olanzapine Zydis 10 mg disintegrating tablets for mood control, Gabapentin 300 mg for agitation, Hydroxyzine 25 mg prn for anxiety & Trazodone 50 mg for insomnia. He was also enrolled & participated in the group counseling sessions being offered &  held on this unit. He learned coping skills that should help him further to cope better & manage his depression/substance abuse issues after discharge.   During the course of his hospitalization, Omar Clark was caught distributing some controlled substances (Opioid) that  he managed to smuggle inside the unit hidden inside of a role of toilet paper. This type of behavior is unacceptable & warrants an immediate discharge from the hospital per the hospital rules & protocols. But, Omar Clark was not discharged. He was rather transferred to another hall this a locked unit to assure adequate monitoring while he is receiving the mental health treatment he mostly needed. Omar Clark's treatment progressed without incident from there on till his discharge date.  Omar Clark has completed detox treatment & his mood is now stable. This is evidenced by his reports of improved mood, absence of suicidal ideations, psychosis & or substance withdrawal symptoms. He is currently being discharged to continue further substance abuse treatment, psychiatric treatment & medication management as noted below. He is provided with all the pertinent information needed to make this appointment without problems.  Upon discharge, Omar Clark adamantly denies any SIHI, AVH, delusional thoughts, paranoia or substance withdrawal symptoms. He is provided with a 14 days worth, supply samples of his [facility] discharge medications. He left [facility] with all personal belongings in no apparent distress. Transportation per family.))  He was discharged on 3/27. Patient states he has had difficulty sleeping which he attributes to noise around his house. While he was in the hospital, it was noted that he was caught distributing controlled substances, but he disputes this. Patient has had some anxiety, but he attributes this to pain. Unclear if he is taking medications from discharge, or if he followed up as planned with  psychiatry. He indicates that he did everything they asked.  NCCSRS database documents that I am the only prescriber  Labs from the hospital were essentially all normal. I see no reason to repeat anything.  PMH-see hospitalization summer 2016 for tickborne illness at [facility]   Review of Systems No recent hives Denies  adderall or ritalin other than one dose prior to ER    Objective:   Physical Exam  Constitutional: He is oriented to person, place, and time. He appears well-developed and well-nourished. No distress.  HENT:  Head: Normocephalic and atraumatic.  Eyes: Conjunctivae and EOM are normal. Pupils are equal, round, and reactive to light.  Neck: Neck supple.  Cardiovascular: Normal rate.   Pulmonary/Chest: Effort normal.  Musculoskeletal: He exhibits no edema.  Neurological: He is alert and oriented to person, place, and time. No cranial nerve deficit. He exhibits normal muscle tone. Coordination normal.  Skin: Skin is warm and dry. No rash noted.  Psychiatric: He has a normal mood and affect. His behavior is normal.  No paranoia today  Nursing note and vitals reviewed. BP 132/82 mmHg  Pulse 79  Temp(Src) 98 F (36.7 C) (Oral)  Resp 17  Ht  (1.803 m)  Wt 168 lb (76.204 kg)  BMI 23.44 kg/m2  SpO2 97%     Assessment & Plan:  Chronic pain syndrome--Meds refilled and he will be referred to pain management for future prescriptions  Substance-induced psychotic disorder with delusions (HCC)--This was the diagnosis from the Behavioral Health admission. He is supposed to have follow-up with them, including both counseling and psychiatry. I am referring him back to his primary care provider to arrange the MRI and orthopedic consults which we've been trying to do for so long. He should  have further pain medication tied to continue with mental health therapy  Paranoia (HCC)--this was noted during hospitalization and needs a more complete characterization by psychiatric evaluation  Tobacco addiction-he has made no progress toward quitting//he has reactive airway disease at this point but no COPD  Chronic urticaria of unknown etiology-stable for now  He will be referred back to Dr. [name] to arrange for chronic pain management, or other orthopedic evaluation, repeat MRI of back after several  years. He has several health maintenance issues I'm sure.  Meds ordered this encounter  Medications  . oxyCODONE (OXYCONTIN) 80 mg 12 hr tablet    Sig: Take 80 mg by mouth every 12 (twelve) hours.  Marland Kitchen. oxyCODONE (OXYCONTIN) 30 MG 12 hr tablet    Sig: Take by mouth.  . oxyCODONE (OXYCONTIN) 80 mg 12 hr tablet    Sig: Take 1 tablet (80 mg total) by mouth every 12 (twelve) hours. For 06/12/15 or after    Dispense:  60 tablet    Refill:  0    Only patient can fill  . oxycodone (ROXICODONE) 30 MG immediate release tablet    Sig: Take 1.5 tablets (45 mg total) by mouth every 4 (four) hours as needed for pain. For 06/12/15 or after    Dispense:  270 tablet    Refill:  0    Only patient can fill     I have completed the patient encounter in its entirety as documented by the scribe, with editing by me where necessary. Adalay Azucena P. Merla Richesoolittle, M.D.

## 2015-06-06 NOTE — Telephone Encounter (Signed)
Pt came in yesterday

## 2015-06-12 NOTE — Progress Notes (Signed)
Done

## 2015-06-30 ENCOUNTER — Encounter: Payer: Self-pay | Admitting: Internal Medicine

## 2015-07-04 ENCOUNTER — Other Ambulatory Visit: Payer: Self-pay | Admitting: Internal Medicine

## 2015-07-04 MED ORDER — OXYCODONE HCL ER 80 MG PO T12A: 80.0000 mg | EXTENDED_RELEASE_TABLET | Freq: Two times a day (BID) | ORAL | Status: DC

## 2015-07-04 MED ORDER — OXYCODONE HCL 30 MG PO TABS: 45.0000 mg | ORAL_TABLET | ORAL | Status: DC | PRN

## 2015-07-04 NOTE — Progress Notes (Unsigned)
Meds ordered this encounter  Medications  . oxyCODONE (OXYCONTIN) 80 mg 12 hr tablet    Sig: Take 1 tablet (80 mg total) by mouth every 12 (twelve) hours. For 07/12/15 or after.    Dispense:  60 tablet    Refill:  0    Patient only to fill  . oxycodone (ROXICODONE) 30 MG immediate release tablet    Sig: Take 1.5 tablets (45 mg total) by mouth every 4 (four) hours as needed for pain. For 07/12/15 or after    Dispense:  270 tablet    Refill:  0    Only patient can fill   NCCSrs confirms no other prescribers F/u [facility] for next prescriptions

## 2015-08-09 ENCOUNTER — Ambulatory Visit (INDEPENDENT_AMBULATORY_CARE_PROVIDER_SITE_OTHER): Payer: Self-pay | Admitting: Internal Medicine

## 2015-08-09 VITALS — BP 118/74 | HR 81 | Temp 97.9°F | Resp 17 | Ht 71.0 in | Wt 170.0 lb

## 2015-08-09 DIAGNOSIS — J22 Unspecified acute lower respiratory infection: Secondary | ICD-10-CM

## 2015-08-09 DIAGNOSIS — J4520 Mild intermittent asthma, uncomplicated: Secondary | ICD-10-CM

## 2015-08-09 DIAGNOSIS — G894 Chronic pain syndrome: Secondary | ICD-10-CM

## 2015-08-09 DIAGNOSIS — F17200 Nicotine dependence, unspecified, uncomplicated: Secondary | ICD-10-CM

## 2015-08-09 DIAGNOSIS — J988 Other specified respiratory disorders: Secondary | ICD-10-CM

## 2015-08-09 MED ORDER — OXYCODONE HCL 30 MG PO TABS: 45.0000 mg | ORAL_TABLET | ORAL | Status: DC | PRN

## 2015-08-09 MED ORDER — PREDNISONE 20 MG PO TABS: ORAL_TABLET | ORAL | Status: DC

## 2015-08-09 MED ORDER — AZITHROMYCIN 250 MG PO TABS: ORAL_TABLET | ORAL | Status: DC

## 2015-08-09 MED ORDER — OXYCODONE HCL ER 80 MG PO T12A: 80.0000 mg | EXTENDED_RELEASE_TABLET | Freq: Two times a day (BID) | ORAL | Status: DC

## 2015-08-09 NOTE — Patient Instructions (Signed)
     IF you received an x-ray today, you will receive an invoice from [facility]. Please contact [facility] at [phone] with questions or concerns regarding your invoice.   IF you received labwork today, you will receive an invoice from [facility]. Please contact [facility] at [phone] with questions or concerns regarding your invoice.   Our billing staff will not be able to assist you with questions regarding bills from these companies.  You will be contacted with the lab results as soon as they are available. The fastest way to get your results is to activate your My Chart account. Instructions are located on the last page of this paperwork. If you have not heard from us regarding the results in 2 weeks, please contact this office.      

## 2015-08-09 NOTE — Progress Notes (Signed)
Subjective:  By signing my name below, I, Raven Small, attest that this documentation has been prepared under the direction and in the presence of [name], MD.  Electronically Signed: Andrew Au, ED Scribe. 08/09/2015. 4:53 PM.   Patient ID: Omar Clark, male    DOB: 08/14/1980, 35 y.o.   MRN: [id number]  HPI Chief Complaint  Patient presents with  . Referral    for pain     HPI Comments: Omar Clark is a 35 y.o. male who [facility] for a referral to pain management. Pt is needing a new referral to [facility]. He states [facility] had been managing his pain for several years before coming here years. Pt would like a refill of oxycodone awaiting the appt.  His PCP Windle Guard has sent a letter asking [place] to refer him for Pain Management.  Pt complains of an improving cough for the past month with associated nasal congestion. Cough initially kept him awake at night but this has improved. His wife mentions hearing pt wheeze during the night while sleeping. Pt denies fever or chills.  Continues to smoke.   Current outpatient prescriptions:  .  gabapentin (NEURONTIN) 300 MG capsule, Take 1 capsule (300 mg total) by mouth 4 (four) times daily. For agitation---does not take///did not like what happened at [facility] so not seeing them .  nicotine (NICODERM CQ -irritated skin .  oxyCODONE (OXYCONTIN) 80 mg 12 hr tablet, Take 1 tablet (80 mg total) by mouth every 12 (twelve) hours.  Marland Kitchen  azithromycin (ZITHROMAX) 250 MG tablet, As packaged, Disp: 6 tablet, Rfl: 0 .  hydrOXYzine (ATARAX/VISTARIL) 25 MG tablet, take 1 tablet by mouth three times a day if needed for anxiety, Disp: , Rfl: 0------rarely takes// .  oxycodone (ROXICODONE) 30 MG immediate release tablet, Take 1.5 tablets (45 mg total) by mouth every 4 (four) hours as needed for pain.  .   Patient Active Problem List   Diagnosis Date Noted  . Stimulant use disorder (HCC)---bx at [facility]  recently 05/24/2015  . Opioid use disorder, moderate, in controlled environment---as prescribed for chronic pain 05/24/2015  . Substance-induced psychotic disorder with delusions (HCC)---see [facility] notes 05/21/2015  . Chronic urticaria 04/14/2013  . Smoker 03/10/2013  . RAD (reactive airway disease) 03/10/2013  . Lumbar degenerative disc disease 11/29/2012  . Chronic pain syndrome 11/29/2012    Past Surgical History  Procedure Laterality Date  . Open reduction internal fixation (orif) hand     Allergies  Allergen Reactions  . Aspirin Hives   Prior to Admission medications   Medication Sig Start Date End Date Taking? Authorizing Provider  gabapentin (NEURONTIN) 300 MG capsule Take 1 capsule (300 mg total) by mouth 4 (four) times daily. For agitation 05/25/15 Not taking Yes Sanjuana Kava, NP  nicotine (NICODERM CQ - DOSED IN MG/24 HOURS) 21 mg/24hr patch Place 1 patch (21 mg total) onto the skin daily. For nicotine addiction 05/23/15 Not taking due to skin irrit Yes Sanjuana Kava, NP  oxyCODONE (OXYCONTIN) 80 mg 12 hr tablet Take 1 tablet (80 mg total) by mouth every 12 (twelve) hours. For 07/12/15 or after. 07/04/15  Yes [name], MD  hydrOXYzine (ATARAX/VISTARIL) 25 MG tablet Take 1 tablet (25 mg total) by mouth 3 (three) times daily as needed for anxiety. Patient not taking: Reported on 08/09/2015 05/23/15 Not taking   Sanjuana Kava, NP  OLANZapine zydis (ZYPREXA) 10 MG disintegrating tablet Take  1 tablet (10 mg total) by mouth at bedtime. For mood control Patient not taking: Reported on 08/09/2015 05/23/15 Not taking  Sanjuana KavaAgnes I Nwoko, NP  traZODone (DESYREL) 50 MG tablet Take 1 tablet (50 mg total) by mouth at bedtime as needed for sleep. Patient not taking: Reported on 08/09/2015 05/23/15 Not taking  Sanjuana KavaAgnes I Nwoko, NP   Social History   Social History  . Marital Status: Married    Spouse Name: N/A  . Number of Children: 3  . Years of Education: 12   Occupational History  . Not on  file.   Social History Main Topics  . Smoking status: Current Every Day Smoker -- 2.00 packs/day for 20 years    Types: Cigarettes  . Smokeless tobacco: Never Used  . Alcohol Use: No  . Drug Use: No  . Sexual Activity: Not on file   Other Topics Concern  . Not on file   Social History Narrative   Review of Systems  Constitutional: Negative for fever and chills.  HENT: Positive for congestion.   Respiratory: Positive for cough and wheezing.    Objective:   Physical Exam  Constitutional: He is oriented to person, place, and time. He appears well-developed and well-nourished. No distress.  HENT:  Head: Normocephalic and atraumatic.  Eyes: Conjunctivae and EOM are normal.  Neck: Neck supple.  Cardiovascular: Normal rate.   Pulmonary/Chest: Effort normal.  Rhonchi bilat bases--wheezing forc expir  Musculoskeletal: Normal range of motion.  Neurological: He is alert and oriented to person, place, and time. No cranial nerve deficit.  Skin: Skin is warm and dry. No rash noted.  Psychiatric: He has a normal mood and affect. His behavior is normal.  Nursing note and vitals reviewed.  Filed Vitals:   08/09/15 1635  BP: 118/74  Pulse: 81  Temp: 97.9 F (36.6 C)  TempSrc: Oral  Resp: 17  Height: 5\' 11"  (1.803 m)  Weight: 170 lb (77.111 kg)  SpO2: 97%    Assessment & Plan:  I have completed the patient encounter in its entirety as documented by the scribe, with editing by me where necessary. Raymir Frommelt P. Merla Richesoolittle, M.D.  Chronic pain syndrome - Plan: Ambulatory referral to Pain Clinic Chronic back pain--I will ask his PCP to have him reevaluated by ortho  Reactive airway disease, mild intermittent, uncomplicated Tobacco use disorder Lower respiratory infection as a result -Zith/pred -must stop smoking--wife here and agrees to help  Meds ordered this encounter  Medications  . oxycodone (ROXICODONE) 30 MG immediate release tablet    Sig: Take 1.5 tablets (45 mg total) by  mouth every 4 (four) hours as needed for pain. For 08/09/15 or after    Dispense:  270 tablet    Refill:  0    Only patient can fill  . azithromycin (ZITHROMAX) 250 MG tablet    Sig: As packaged    Dispense:  6 tablet    Refill:  0  . predniSONE (DELTASONE) 20 MG tablet    Sig: 3/3/2/2/1/1 single daily dose for 6 days    Dispense:  12 tablet    Refill:  0    -  Oxycodone 80mg  12hr tab #60 1 bid

## 2015-09-07 ENCOUNTER — Ambulatory Visit (INDEPENDENT_AMBULATORY_CARE_PROVIDER_SITE_OTHER): Payer: Self-pay | Admitting: Internal Medicine

## 2015-09-07 VITALS — BP 120/74 | HR 94 | Temp 98.5°F | Resp 18 | Ht 71.0 in | Wt 173.0 lb

## 2015-09-07 DIAGNOSIS — Z720 Tobacco use: Secondary | ICD-10-CM

## 2015-09-07 DIAGNOSIS — M5136 Other intervertebral disc degeneration, lumbar region: Secondary | ICD-10-CM

## 2015-09-07 DIAGNOSIS — F17200 Nicotine dependence, unspecified, uncomplicated: Secondary | ICD-10-CM

## 2015-09-07 DIAGNOSIS — G894 Chronic pain syndrome: Secondary | ICD-10-CM

## 2015-09-07 MED ORDER — OXYCODONE HCL 30 MG PO TABS: 45.0000 mg | ORAL_TABLET | ORAL | Status: AC | PRN

## 2015-09-07 MED ORDER — OXYCODONE HCL ER 80 MG PO T12A: 80.0000 mg | EXTENDED_RELEASE_TABLET | Freq: Two times a day (BID) | ORAL | Status: DC

## 2015-09-07 MED ORDER — NICOTINE 21-14-7 MG/24HR TD KIT: PACK | TRANSDERMAL | Status: AC

## 2015-09-07 NOTE — Progress Notes (Signed)
Follow up Chronic pain syndrome  Lumbar degenerative disc disease  Smoker  See OV 64moago Referral to [facility]=not yet set up(sent 1 mo ago to [facility] where he was for several yrs before coming here-? Not responded)  Pulm sxt responded but now needs to quit smoking--[facility]  Chronic pain syndrome  Lumbar degenerative disc disease  Smoker  Meds ordered this encounter  Medications  . Nicotine 21-14-7 MG/24HR KIT    Sig: 1 month at 21, 1 month at 14 and 1 month at 7    Dispense:  1 each    Refill:  11    Needs Dr Reddy's brand to avoid reaction to adhesive  . oxyCODONE (OXYCONTIN) 80 mg 12 hr tablet    Sig: Take 1 tablet (80 mg total) by mouth every 12 (twelve) hours.    Dispense:  60 tablet    Refill:  0    Patient only to fill  . oxycodone (ROXICODONE) 30 MG immediate release tablet    Sig: Take 1.5 tablets (45 mg total) by mouth every 4 (four) hours as needed for pain.    Dispense:  270 tablet    Refill:  0    Only patient can fill   Will retry referral to pain management--[facility] vs Dr PMirna MiresHis PCP is Dr WClaris Gowerwho needs to ref him to ortho to reevaluate other possible interventions--i've sent a letter

## 2015-09-07 NOTE — Patient Instructions (Signed)
     IF you received an x-ray today, you will receive an invoice from [facility]. Please contact [facility] at [phone] with questions or concerns regarding your invoice.   IF you received labwork today, you will receive an invoice from [facility]. Please contact [facility] at [phone] with questions or concerns regarding your invoice.   Our billing staff will not be able to assist you with questions regarding bills from these companies.  You will be contacted with the lab results as soon as they are available. The fastest way to get your results is to activate your My Chart account. Instructions are located on the last page of this paperwork. If you have not heard from us regarding the results in 2 weeks, please contact this office.      

## 2015-10-13 ENCOUNTER — Ambulatory Visit (INDEPENDENT_AMBULATORY_CARE_PROVIDER_SITE_OTHER): Payer: Self-pay | Admitting: Physician Assistant

## 2015-10-13 VITALS — BP 122/82 | HR 80 | Temp 98.1°F | Resp 18 | Ht 71.0 in | Wt 179.0 lb

## 2015-10-13 DIAGNOSIS — G894 Chronic pain syndrome: Secondary | ICD-10-CM

## 2015-10-13 DIAGNOSIS — R21 Rash and other nonspecific skin eruption: Secondary | ICD-10-CM

## 2015-10-13 MED ORDER — OXYCODONE HCL ER 80 MG PO T12A: 80.0000 mg | EXTENDED_RELEASE_TABLET | Freq: Two times a day (BID) | ORAL | 0 refills | Status: DC

## 2015-10-13 MED ORDER — OXYCODONE HCL 30 MG PO TABS: 30.0000 mg | ORAL_TABLET | ORAL | 0 refills | Status: DC | PRN

## 2015-10-13 MED ORDER — TRIAMCINOLONE ACETONIDE 0.1 % EX CREA: 1.0000 "application " | TOPICAL_CREAM | Freq: Two times a day (BID) | CUTANEOUS | 0 refills | Status: AC

## 2015-10-13 NOTE — Progress Notes (Addendum)
[facility] [address] [place] [address] [phone]  By signing my name below I, Omar Clark, attest that this documentation has been prepared under the direction and in the presence of Ivar Drape PA. Electonically Signed. Omar Clark, Scribe 10/13/2015 at 4:07 PM  Date:  10/13/2015   Name:  [name]   DOB:  [birth year]   MRN:  [id number]  PCP:  No PCP Per Patient    History of Present Illness: Chief Complaint  Patient presents with   Medication Refill    Oxycodone 80 and 30 mg    TYON CERASOLI is a 35 y.o. male patient who presents to [facility] for a medication refill of oxycodone 80 and 30 mg. Pt previously followed by Dr. Laney Pastor. PCP- Dr. Renold Don. Hx of chronic pain back pain. 2 MRI lumbar have been ordered but non have been done. Last refill was 7/06. At that visit referral was resent to [facility].  Pt has an appointment with [facility] 9/8. He has been trying stretch his prescription until appointment but has run out.  Pt has chronic back pain, L4,5,6. He states he had a past due balance after having a spine stimulator placed and was not able to get an appointment with [facility] until balance was paid. Pt continues to smoke. He is willing to try Chantix. 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   Chronic urticaria 04/14/2013   Smoker 03/10/2013   RAD (reactive airway disease) 03/10/2013   Opiate addiction ([place]) 11/29/2012   Lumbar degenerative disc disease 11/29/2012   Chronic pain syndrome 11/29/2012    Past Medical History:  Diagnosis Date   Chronic back pain    Depression     Past Surgical History:  Procedure Laterality Date   OPEN REDUCTION INTERNAL FIXATION (ORIF) HAND       Social History  Substance Use Topics   Smoking status: Current Every Day Smoker    Packs/day: 2.00    Years: 20.00    Types: Cigarettes   Smokeless tobacco: Never Used   Alcohol use No    Family History  Problem Relation Age of Onset   Cancer Mother    Cancer Maternal Grandmother    Cancer Maternal Grandfather    Cancer Paternal Grandmother    Alzheimer's disease Paternal Grandfather     Allergies  Allergen Reactions   Aspirin Hives    Medication list has been reviewed and updated.  Current Outpatient Prescriptions on File Prior to Visit  Medication Sig Dispense Refill   oxyCODONE (OXYCONTIN) 80 mg 12 hr tablet Take 1 tablet (80 mg total) by mouth every 12 (twelve) hours. 60 tablet 0   hydrOXYzine (ATARAX/VISTARIL) 25 MG tablet take 1 tablet by mouth three times a day if needed for anxiety  0   Nicotine 21-14-7 MG/24HR KIT 1 month at 21, 1 month at 14 and 1 month at 7 (Patient not taking: Reported on 10/13/2015) 1 each 11   No current facility-administered medications on file prior to visit.     ROS ROS unremarkable unless otherwise specified.  Physical Examination: BP 122/82 (BP Location: Right Arm, Patient Position: Sitting, Cuff Size: Normal)    Pulse 80    Temp 98.1 F (36.7 C) (Oral)    Resp 18  Ht _0  (1.803 m)    Wt 179 lb (81.2 kg)    SpO2 99%    BMI 24.97 kg/m  Ideal Body Weight: _1 ([id number])@  Physical Exam  Constitutional: He is oriented to person, place, and time. He appears well-developed and well-nourished. No distress.  HENT:  Head: Normocephalic and atraumatic.  Eyes: Conjunctivae and EOM are normal. Pupils are equal, round, and reactive to light.  Cardiovascular: Normal rate, regular rhythm and normal heart sounds.  Exam reveals no gallop and no friction rub.   No murmur heard. Pulmonary/Chest: Effort normal and breath sounds normal. No respiratory distress. He has no wheezes. He has no rales.  Neurological: He is  alert and oriented to person, place, and time.  Skin: Skin is warm and dry. He is not diaphoretic.  Left anterior forearm with erythematous papules that are nontender and raised--scant lesions appearing on other forearm.   Psychiatric: He has a normal mood and affect. His behavior is normal.     Assessment and Plan: Omar Clark is a 35 y.o. male who is here today for cc of rash and medication refill. At this time we can relinquish care to the [facility].  Medication refill given for 1 month.  Controlled substance panel appears consistent Advised not to use topical cream longer than 1 week.   Rash and nonspecific skin eruption - Plan: triamcinolone cream (KENALOG) 0.1 %  Chronic pain syndrome - Plan: oxyCODONE (OXYCONTIN) 80 mg 12 hr tablet, oxycodone (ROXICODONE) 30 MG immediate release tablet   Ivar Drape, PA-C [facility] 10/13/2015 4:07 PM  I personally performed the services described in this documentation, which was scribed in my presence. The recorded information has been reviewed and is accurate.

## 2015-10-13 NOTE — Patient Instructions (Addendum)
  I am placing the medication for your use for one month, while we await pain management.  Bring the medicines that you have left with you to the visit, and this AVS summary.    IF you received an x-ray today, you will receive an invoice from [facility]. Please contact [facility] at [phone] with questions or concerns regarding your invoice.   IF you received labwork today, you will receive an invoice from [facility]. Please contact [facility] at [phone] with questions or concerns regarding your invoice.   Our billing staff will not be able to assist you with questions regarding bills from these companies.  You will be contacted with the lab results as soon as they are available. The fastest way to get your results is to activate your My Chart account. Instructions are located on the last page of this paperwork. If you have not heard from us regarding the results in 2 weeks, please contact this office.

## 2015-11-10 ENCOUNTER — Ambulatory Visit (INDEPENDENT_AMBULATORY_CARE_PROVIDER_SITE_OTHER): Payer: Self-pay | Admitting: Physician Assistant

## 2015-11-10 ENCOUNTER — Encounter: Payer: Self-pay | Admitting: Physician Assistant

## 2015-11-10 DIAGNOSIS — G894 Chronic pain syndrome: Secondary | ICD-10-CM

## 2015-11-10 MED ORDER — OXYCODONE HCL ER 80 MG PO T12A: 80.0000 mg | EXTENDED_RELEASE_TABLET | Freq: Two times a day (BID) | ORAL | 0 refills | Status: DC

## 2015-11-10 MED ORDER — OXYCODONE HCL 30 MG PO TABS: 30.0000 mg | ORAL_TABLET | ORAL | 0 refills | Status: DC | PRN

## 2015-11-10 MED ORDER — OXYCODONE HCL 30 MG PO TABS
45.0000 mg | ORAL_TABLET | ORAL | 0 refills | Status: AC | PRN
Start: 2015-11-10 — End: ?

## 2015-11-10 NOTE — Patient Instructions (Addendum)
Please follow up with [facility] as we discussed.      IF you received an x-ray today, you will receive an invoice from [facility]. Please contact [facility] at [phone] with questions or concerns regarding your invoice.   IF you received labwork today, you will receive an invoice from [facility]. Please contact [facility] at [phone] with questions or concerns regarding your invoice.   Our billing staff will not be able to assist you with questions regarding bills from these companies.  You will be contacted with the lab results as soon as they are available. The fastest way to get your results is to activate your My Chart account. Instructions are located on the last page of this paperwork. If you have not heard from us regarding the results in 2 weeks, please contact this office.

## 2015-11-10 NOTE — Progress Notes (Signed)
Patient ID: Omar Clark, male   DOB: [birth year], 35 y.o.   MRN: [id number] [facility] [address] [place] [address] [phone]  By signing my name below, I, [name], attest that this documentation has been prepared under the direction and in the presence of Ivar Drape, PA-C Electronically Signed: Ladene Artist, ED Scribe 11/10/2015 at 12:04 PM.  Date:  11/10/2015   Name:  Omar Clark   DOB:  Feb 11, 1981   MRN:  [id number]  PCP:  No PCP Per Patient   History of Present Illness:  Omar Clark is a 35 y.o. male patient, with a h/o chronic pain syndrome and lumbar degenerative disc disease, who presents to [facility] for a medication refill of oxycodone. Pt had an appointment at [facility] scheduled for today, 11/10/15, but states that he cannot be seen until his past due balance of $600 is paid. Pt went by [facility] today and paid half. He states that he would have been able to pay the total bill today but he has been moving for the past 2 weeks to [place] with his wife and 4 children. Pt states that he has been seen at [facility] in the past for several years and never had a problem at the clinic. He stopped going when he lost his insurance but states that he recently got Medicaid.   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   Chronic urticaria 04/14/2013   Smoker 03/10/2013   RAD (reactive airway disease) 03/10/2013   Opiate addiction ([place]) 11/29/2012   Lumbar degenerative disc disease 11/29/2012   Chronic pain syndrome 11/29/2012    Past Medical History:  Diagnosis Date   Chronic back pain    Depression     Past Surgical History:  Procedure Laterality Date   OPEN REDUCTION INTERNAL FIXATION (ORIF) HAND      Social History  Substance Use Topics    Smoking status: Current Every Day Smoker    Packs/day: 2.00    Years: 20.00    Types: Cigarettes   Smokeless tobacco: Never Used   Alcohol use No    Family History  Problem Relation Age of Onset   Cancer Mother    Cancer Maternal Grandmother    Cancer Maternal Grandfather    Cancer Paternal Grandmother    Alzheimer's disease Paternal Grandfather     Allergies  Allergen Reactions   Aspirin Hives    Medication list has been reviewed and updated.  Current Outpatient Prescriptions on File Prior to Visit  Medication Sig Dispense Refill   hydrOXYzine (ATARAX/VISTARIL) 25 MG tablet take 1 tablet by mouth three times a day if needed for anxiety  0   oxyCODONE (OXYCONTIN) 80 mg 12 hr tablet Take 1 tablet (80 mg total) by mouth every 12 (twelve) hours. 60 tablet 0   oxycodone (ROXICODONE) 30 MG immediate release tablet Take 1 tablet (30 mg total) by mouth every 4 (four) hours as needed for pain. 270 tablet 0   triamcinolone cream (KENALOG) 0.1 % Apply 1 application topically 2 (two) times daily. 30 g 0   Nicotine 21-14-7 MG/24HR KIT 1 month at 21, 1 month at 14 and 1 month at 7 (Patient not taking: Reported on 10/13/2015) 1 each 11   oxyCODONE 30 MG 12 hr tablet Take 30 mg by mouth  every 4 (four) hours as needed.     No current facility-administered medications on file prior to visit.     Review of Systems  Musculoskeletal: Positive for back pain.    Physical Examination: BP 122/72 (BP Location: Right Arm, Patient Position: Sitting, Cuff Size: Normal)    Pulse 80    Temp 98.5 F (36.9 C) (Oral)    Resp 17    Ht _0  (1.803 m)    Wt 174 lb (78.9 kg)    SpO2 98%    BMI 24.27 kg/m  Ideal Body Weight: _1 ([id number])@  Physical Exam  Constitutional: He is oriented to person, place, and time. He appears well-developed and well-nourished. No distress.  HENT:  Head: Normocephalic and atraumatic.  Eyes: Conjunctivae and EOM are normal. Pupils are equal, round, and  reactive to light.  Cardiovascular: Normal rate, regular rhythm, normal heart sounds and normal pulses.  Exam reveals no gallop and no friction rub.   No murmur heard. Pulmonary/Chest: Effort normal and breath sounds normal. No respiratory distress.  Neurological: He is alert and oriented to person, place, and time.  Skin: Skin is warm and dry. He is not diaphoretic.  Psychiatric: He has a normal mood and affect. His behavior is normal.    Assessment and Plan: Omar Clark is a 35 y.o. male who is here today  [facility] confirms that he has a balance and must pay this off.  Pamala Hurry of the [facility] states that he will get him in asap after he pays this off.  He states that this will be done in 1 month.  I will refill today, and may consider next month, pending scheduling.control database consistent without multi-providers. Chronic pain syndrome - Plan: oxycodone (ROXICODONE) 30 MG immediate release tablet, DISCONTINUED: oxycodone (ROXICODONE) 30 MG immediate release tablet, DISCONTINUED: oxyCODONE (OXYCONTIN) 80 mg 12 hr tablet, DISCONTINUED: oxyCODONE (OXYCONTIN) 80 mg 12 hr tablet   Ivar Drape, PA-C [facility] 11/10/2015 12:04 PM

## 2015-11-15 ENCOUNTER — Telehealth: Payer: Self-pay

## 2015-11-15 DIAGNOSIS — G894 Chronic pain syndrome: Secondary | ICD-10-CM

## 2015-11-15 NOTE — Telephone Encounter (Signed)
Pt says his Medicaid insurance is needing approval for his oxycontin (he thinks).  He says they are requiring smaller amounts of pills and only certain doses to be filled.  Please call him.  He says he called yesterday and they told him to speak to [place], but there is no message.  [phone]

## 2015-11-17 MED ORDER — OXYCODONE HCL ER 80 MG PO T12A: 80.0000 mg | EXTENDED_RELEASE_TABLET | Freq: Two times a day (BID) | ORAL | 0 refills | Status: AC

## 2015-11-17 NOTE — Telephone Encounter (Signed)
Proceed with prior auth (hold for Ms. English to review and sign). I will Rx 14-day supply to cover until this can be done.  Meds ordered this encounter  Medications  . oxyCODONE (OXYCONTIN) 80 mg 12 hr tablet    Sig: Take 1 tablet (80 mg total) by mouth every 12 (twelve) hours.    Dispense:  28 tablet    Refill:  0    Patient only to fill

## 2015-11-17 NOTE — Telephone Encounter (Signed)
Completed as much as possible on [employer] form. Checked formulary and name brand Oxycontin is on the preferred list. I called pharm and they verified that the problem is not the medication, but that starting Sept 1 Medicaid started new policy that they will only fill for 2 weeks at a time. A PA has to be done if 30 day Rx is wanted. I have put the form in Stephanie's box to answer some questions that I could not, but there is nowhere to write why pt has to have a 30 day Rx instead of a 2 week Rx at a time, and not sure what that reason would be other than convenience.  Spoke to [place] and pended a 2 wk Rx to cover pt until [place] returns.

## 2015-11-20 NOTE — Telephone Encounter (Signed)
Advised pt that 2 wk Rx is ready to p/up and explained that even if PA is denied, it would just be a matter of him having to go to the pharm to get a Rx filled Q2wks instead of Qmos, but he would be able to get the same amount per day. Pt voiced understanding and I advised I will let him know what PA decision is when we are notified.

## 2015-11-21 ENCOUNTER — Telehealth: Payer: Self-pay

## 2015-11-21 NOTE — Telephone Encounter (Signed)
Pt came in to say that what was sent to Medicaid was not what they needed.  He needs you to call him to advise you what they said.  [phone]

## 2015-11-22 NOTE — Telephone Encounter (Signed)
The same form was filled out, and you faxed it two days ago, but at the time we had been told that the only problem was that form needed to be filled out so pt could get 30 day supply instead of 2 wk at a time. Pt called back and stated that Medicaid told him that the real problem is that the amount pt gets per day (160 mg) is higher than the limit they cover (120 mg total daily), so the reason he needs the 160 mg needs to be specifically written on form. I wrote this info on the form I put in your box today, which needs to be re-sent in to include today's info.

## 2015-11-22 NOTE — Telephone Encounter (Signed)
I have faxed this 2 days ago, the copy along with fax confirmation in in my mailbox.

## 2015-11-22 NOTE — Telephone Encounter (Addendum)
Pt advised that Medicaid told him that they need their form filled out with info as to why he needs 160 mg total daily. There normal approved limit is 120 mg daily. Pt stated that [facility] originally treated his nerve pain in back and legs with Oxycontin 60 mg BID but it was not effective and they increased him to 80 mg BID which has been much more effective in controlling the pain. Completed new form and will put in [facility] box for completion.

## 2015-11-23 NOTE — Telephone Encounter (Signed)
Signed and given to [employer] briggs yesterday for faxing.

## 2015-11-24 NOTE — Telephone Encounter (Signed)
This (2nd) form was faxed in yesterday with confirmation.

## 2015-11-27 ENCOUNTER — Telehealth: Payer: Self-pay

## 2015-11-27 NOTE — Telephone Encounter (Signed)
[facility] was calling stating that he had a referrall sent in by fax for a request. Pt is no longer being seen until he contacts the billing department for that facility.  Please advise

## 2015-11-29 NOTE — Telephone Encounter (Signed)
This may be an old referral.  I do not know what this is as I did not send it.  I refilled his meds.  I spoke to [place] [employer] I think, and he has a financial plan to pay them back prior to restarting services.  So hopefully he can pay this back within 1-2 months.

## 2016-11-14 ENCOUNTER — Ambulatory Visit: Payer: Self-pay | Admitting: Family Medicine

## 2016-11-14 ENCOUNTER — Telehealth: Payer: Self-pay | Admitting: Family Medicine

## 2016-11-14 ENCOUNTER — Telehealth: Payer: Self-pay

## 2016-11-14 NOTE — Telephone Encounter (Signed)
Called pt to advise dr Creta Levinstallings unable to manage his pain medications.  Pt advises he doesn't want her to manage meds but only needs a 2 week supply of meds until he finds another provider as the previous has stopped writing for his pain meds.  Advised pt he hasn't been seen in a year and MD not comfortable writing for pain meds.  But will give message to doctor and call him back with her response.  Pt agreeable. dg

## 2016-11-14 NOTE — Telephone Encounter (Signed)
Spoke with patient via phone an advised Dr. Creta LevinStallings will not be able to fill pain medications as requested.  Patient verbalized understanding. dg

## 2016-11-14 NOTE — Telephone Encounter (Addendum)
This patient has been scheduled with me to be seen Saturday 9/15.  The chief complaint states that he needs a two-week medication supply and that Dr. Creta LevinStallings would not see patient for this today. Note on visit states for me to let schedulers know whether patient can be seen or needs to seek care elsewhere. Prior telephone call states that patient only needs a two-week supply to allow him to find time to find another provider as his previous provider stopped prescribing him medication.   In order for me to feel safe providing him medication, I will need the full records or a note from his prior provider as to why they are no longer able to continue prescribing to him at the time of the visit.  He should also have a visit scheduled with the new provider with the date and name of who he will be seeing so we can ensure they receive a note from us so that it doesn't look like he is "doctor-shopping."  Without this, I will not be able to prescribe him controlled medications though I will be able to place referrals for him if needed or discuss alternative measures for sx control.

## 2016-11-15 ENCOUNTER — Telehealth: Payer: Self-pay | Admitting: Family Medicine

## 2016-11-15 NOTE — Telephone Encounter (Signed)
Spoke with patient and advised that office will be closed on Saturday, November 16, 2016.  He will be contacted early next week to reschedule appointment.   Please call to reschedule patient with Dr. Clelia Croft.

## 2016-11-16 ENCOUNTER — Ambulatory Visit: Payer: Self-pay | Admitting: Family Medicine

## 2016-11-18 ENCOUNTER — Encounter: Payer: Self-pay | Admitting: Family Medicine

## 2016-11-18 NOTE — Telephone Encounter (Signed)
Sent pt a Clinical cytogeneticist message with Dr Alver Fisher message about what all she will need before she can give him medications.will wait for patient to call us with this information before making him another apt

## 2016-11-20 ENCOUNTER — Ambulatory Visit (INDEPENDENT_AMBULATORY_CARE_PROVIDER_SITE_OTHER): Payer: Self-pay | Admitting: Family Medicine

## 2016-11-20 ENCOUNTER — Ambulatory Visit (HOSPITAL_COMMUNITY)
Admission: EM | Admit: 2016-11-20 | Discharge: 2016-11-20 | Disposition: A | Discharge status: Home / Self Care | Payer: Medicaid Other | Attending: Family Medicine | Admitting: Family Medicine

## 2016-11-20 ENCOUNTER — Encounter (HOSPITAL_COMMUNITY): Payer: Self-pay | Admitting: *Deleted

## 2016-11-20 ENCOUNTER — Encounter: Payer: Self-pay | Admitting: Family Medicine

## 2016-11-20 VITALS — BP 140/84 | HR 86 | Temp 98.3°F | Resp 18 | Ht 71.0 in | Wt 164.6 lb

## 2016-11-20 DIAGNOSIS — G894 Chronic pain syndrome: Secondary | ICD-10-CM

## 2016-11-20 DIAGNOSIS — R079 Chest pain, unspecified: Secondary | ICD-10-CM

## 2016-11-20 DIAGNOSIS — K2960 Other gastritis without bleeding: Secondary | ICD-10-CM

## 2016-11-20 DIAGNOSIS — R209 Unspecified disturbances of skin sensation: Secondary | ICD-10-CM

## 2016-11-20 DIAGNOSIS — T39395A Adverse effect of other nonsteroidal anti-inflammatory drugs [NSAID], initial encounter: Secondary | ICD-10-CM

## 2016-11-20 DIAGNOSIS — M5136 Other intervertebral disc degeneration, lumbar region: Secondary | ICD-10-CM

## 2016-11-20 DIAGNOSIS — R030 Elevated blood-pressure reading, without diagnosis of hypertension: Secondary | ICD-10-CM

## 2016-11-20 DIAGNOSIS — R51 Headache: Secondary | ICD-10-CM

## 2016-11-20 DIAGNOSIS — Z791 Long term (current) use of non-steroidal anti-inflammatories (NSAID): Secondary | ICD-10-CM

## 2016-11-20 DIAGNOSIS — R519 Headache, unspecified: Secondary | ICD-10-CM

## 2016-11-20 DIAGNOSIS — IMO0001 Reserved for inherently not codable concepts without codable children: Secondary | ICD-10-CM

## 2016-11-20 DIAGNOSIS — F17200 Nicotine dependence, unspecified, uncomplicated: Secondary | ICD-10-CM

## 2016-11-20 DIAGNOSIS — M51369 Other intervertebral disc degeneration, lumbar region without mention of lumbar back pain or lower extremity pain: Secondary | ICD-10-CM

## 2016-11-20 DIAGNOSIS — J309 Allergic rhinitis, unspecified: Secondary | ICD-10-CM

## 2016-11-20 NOTE — Patient Instructions (Addendum)
I think it is likely that your chest pain is coming from gastroesophageal reflux from the chronic ibuprofen use though certainly development of lung disease from the chronic tobacco use could also be a potential etiology. I suspect that your elevated blood pressure is due to the pain and discomfort you are having as well as the anxiety being caused by the fact that you don't know what is causing these issues.  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  However, in both of these issues I would likely proceed with a full workup including a peak flow pre-and post albuterol nebulizer, a GI cocktail, chest x-ray, EKG, and lab work including a CBC, sedimentation rate, and complete metabolic profile. I am hesitant to proceed with all of this testing considering it would all be out of pocket since we are unable to bill Medicaid so am sending you to [facility] urgent care in hopes that they will be able to start your workup and a more cost-effective manner.  I recommend starting on a daily proton pump inhibitor (like omeprazole, pantoprazole, esomeprazole, etc) 30 minutes before breakfast and dinner daily. It would be best to stop the ibuprofen and try changing to acetaminophen for several weeks. Start daily steroid nasal spray (like flonase, nasonex, nasacort, rhinocort) and a daily antihistamine (like cetirizine) for at least 2 weeks.  IF you received an x-ray today, you will receive an invoice from [facility]. Please contact [facility] at [phone] with questions or concerns regarding your invoice.   IF you received labwork today, you will receive an invoice from [facility]. Please contact [facility] at [phone] with questions or concerns regarding your invoice.   Our billing staff will not be able to assist you with questions regarding bills from these  companies.  You will be contacted with the lab results as soon as they are available. The fastest way to get your results is to activate your My Chart account. Instructions are located on the last page of this paperwork. If you have not heard from [place] regarding the results in 2 weeks, please contact this office.     Nonspecific Chest Pain Chest pain can be caused by many different conditions. There is always a chance that your pain could be related to something serious, such as a heart attack or a blood clot in your lungs. Chest pain can also be caused by conditions that are not life-threatening. If you have chest pain, it is very important to follow up with your health care provider. What are the causes? Causes of this condition include:  Heartburn.  Pneumonia or bronchitis.  Anxiety or stress.  Inflammation around your heart (pericarditis) or lung (pleuritis or pleurisy).  A blood clot in your lung.  A collapsed lung (pneumothorax). This can develop suddenly on its own (spontaneous pneumothorax) or from trauma to the chest.  Shingles infection (varicella-zoster virus).  Heart attack.  Damage to the bones, muscles, and cartilage that make up your chest wall. This can include: ? Bruised bones due to injury. ? Strained muscles or cartilage due to frequent or repeated coughing or overwork. ? Fracture to one or more ribs. ? Sore cartilage due to inflammation (costochondritis).  What increases the risk? Risk factors for this condition may include:  Activities that increase your risk for trauma or injury to your chest.  Respiratory infections or conditions that cause frequent coughing.  Medical conditions or overeating that can cause heartburn.  Heart disease or family history of heart disease.  Conditions or health behaviors that increase your risk of developing a blood clot.  Having had chicken pox (varicella zoster).  What are the signs or symptoms? Chest pain can feel  like:  Burning or tingling on the surface of your chest or deep in your chest.  Crushing, pressure, aching, or squeezing pain.  Dull or sharp pain that is worse when you move, cough, or take a deep breath.  Pain that is also felt in your back, neck, shoulder, or arm, or pain that spreads to any of these areas.  Your chest pain may come and go, or it may stay constant. How is this diagnosed? Lab tests or other studies may be needed to find the cause of your pain. Your health care provider may have you take a test called an ECG (electrocardiogram). An ECG records your heartbeat patterns at the time the test is performed. You may also have other tests, such as:  Transthoracic echocardiogram (TTE). In this test, sound waves are used to create a picture of the heart structures and to look at how blood flows through your heart.  Transesophageal echocardiogram (TEE).This is a more advanced imaging test that takes images from inside your body. It allows your health care provider to see your heart in finer detail.  Cardiac monitoring. This allows your health care provider to monitor your heart rate and rhythm in real time.  Holter monitor. This is a portable device that records your heartbeat and can help to diagnose abnormal heartbeats. It allows your health care provider to track your heart activity for several days, if needed.  Stress tests. These can be done through exercise or by taking medicine that makes your heart beat more quickly.  Blood tests.  Other imaging tests.  How is this treated? Treatment depends on what is causing your chest pain. Treatment may include:  Medicines. These may include: ? Acid blockers for heartburn. ? Anti-inflammatory medicine. ? Pain medicine for inflammatory conditions. ? Antibiotic medicine, if an infection is present. ? Medicines to dissolve blood clots. ? Medicines to treat coronary artery disease (CAD).  Supportive care for conditions that do  not require medicines. This may include: ? Resting. ? Applying heat or cold packs to injured areas. ? Limiting activities until pain decreases.  Follow these instructions at home: Medicines  If you were prescribed an antibiotic, take it as told by your health care provider. Do not stop taking the antibiotic even if you start to feel better.  Take over-the-counter and prescription medicines only as told by your health care provider. Lifestyle  Do not use any products that contain nicotine or tobacco, such as cigarettes and e-cigarettes. If you need help quitting, ask your health care provider.  Do not drink alcohol.  Make lifestyle changes as directed by your health care provider. These may include: ? Getting regular exercise. Ask your health care provider to suggest some activities that are safe for you. ? Eating a heart-healthy diet. A registered dietitian can help you to learn healthy eating options. ? Maintaining a healthy weight. ? Managing diabetes, if necessary. ? Reducing stress, such as with yoga or relaxation techniques. General instructions  Avoid any activities that bring on chest pain.  If heartburn is the cause for your chest pain, raise (elevate) the head of your bed about 6 inches (15 cm) by putting blocks under the legs. Sleeping with more  pillows does not effectively relieve heartburn because it only changes the position of your head.  Keep all follow-up visits as told by your health care provider. This is important. This includes any further testing if your chest pain does not go away. Contact a health care provider if:  Your chest pain does not go away.  You have a rash with blisters on your chest.  You have a fever.  You have chills. Get help right away if:  Your chest pain is worse.  You have a cough that gets worse, or you cough up blood.  You have severe pain in your abdomen.  You have severe weakness.  You faint.  You have sudden, unexplained  chest discomfort.  You have sudden, unexplained discomfort in your arms, back, neck, or jaw.  You have shortness of breath at any time.  You suddenly start to sweat, or your skin gets clammy.  You feel nauseous or you vomit.  You suddenly feel light-headed or dizzy.  Your heart begins to beat quickly, or it feels like it is skipping beats. These symptoms may represent a serious problem that is an emergency. Do not wait to see if the symptoms will go away. Get medical help right away. Call your local emergency services (911 in the [place] Do not drive yourself to the hospital. This information is not intended to replace advice given to you by your health care provider. Make sure you discuss any questions you have with your health care provider. Document Released: 11/28/2004 Document Revised: 11/13/2015 Document Reviewed: 11/13/2015 Elsevier Interactive Patient Education  2017 [employer]  Heartburn Heartburn is a type of pain or discomfort that can happen in the throat or chest. It is often described as a burning pain. It may also cause a bad taste in the mouth. Heartburn may feel worse when you lie down or bend over, and it is often worse at night. Heartburn may be caused by stomach contents that move back up into the esophagus (reflux). Follow these instructions at home: Take these actions to decrease your discomfort and to help avoid complications. Diet  Follow a diet as recommended by your health care provider. This may involve avoiding foods and drinks such as: ? Coffee and tea (with or without caffeine). ? Drinks that contain alcohol. ? Energy drinks and sports drinks. ? Carbonated drinks or sodas. ? Chocolate and cocoa. ? Peppermint and mint flavorings. ? Garlic and onions. ? Horseradish. ? Spicy and acidic foods, including peppers, chili powder, curry powder, vinegar, hot sauces, and barbecue sauce. ? Citrus fruit juices and citrus fruits, such as oranges, lemons, and  limes. ? Tomato-based foods, such as red sauce, chili, salsa, and pizza with red sauce. ? Fried and fatty foods, such as donuts, french fries, potato chips, and high-fat dressings. ? High-fat meats, such as hot dogs and fatty cuts of red and white meats, such as rib eye steak, sausage, ham, and bacon. ? High-fat dairy items, such as whole milk, butter, and cream cheese.  Eat small, frequent meals instead of large meals.  Avoid drinking large amounts of liquid with your meals.  Avoid eating meals during the 2-3 hours before bedtime.  Avoid lying down right after you eat.  Do not exercise right after you eat. General instructions  Pay attention to any changes in your symptoms.  Take over-the-counter and prescription medicines only as told by your health care provider. Do not take aspirin, ibuprofen, or other NSAIDs unless your health care provider  told you to do so.  Do not use any tobacco products, including cigarettes, chewing tobacco, and e-cigarettes. If you need help quitting, ask your health care provider.  Wear loose-fitting clothing. Do not wear anything tight around your waist that causes pressure on your abdomen.  Raise (elevate) the head of your bed about 6 inches (15 cm).  Try to reduce your stress, such as with yoga or meditation. If you need help reducing stress, ask your health care provider.  If you are overweight, reduce your weight to an amount that is healthy for you. Ask your health care provider for guidance about a safe weight loss goal.  Keep all follow-up visits as told by your health care provider. This is important. Contact a health care provider if:  You have new symptoms.  You have unexplained weight loss.  You have difficulty swallowing, or it hurts to swallow.  You have wheezing or a persistent cough.  Your symptoms do not improve with treatment.  You have frequent heartburn for more than two weeks. Get help right away if:  You have pain in  your arms, neck, jaw, teeth, or back.  You feel sweaty, dizzy, or light-headed.  You have chest pain or shortness of breath.  You vomit and your vomit looks like blood or coffee grounds.  Your stool is bloody or black. This information is not intended to replace advice given to you by your health care provider. Make sure you discuss any questions you have with your health care provider. Document Released: 07/07/2008 Document Revised: 07/27/2015 Document Reviewed: 06/15/2014 Elsevier Interactive Patient Education  2017 [employer].  Analgesic Rebound Headache An analgesic rebound headache, sometimes called a medication overuse headache, is a headache that comes after pain medicine (analgesic) taken to treat the original (primary) headache has worn off. Any type of primary headache can return as a rebound headache if a person regularly takes analgesics more than three times a week to treat it. The types of primary headaches that are commonly associated with rebound headaches include:  Migraines.  Headaches that arise from tense muscles in the head and neck area (tension headaches).  Headaches that develop and happen again (recur) on one side of the head and around the eye (cluster headaches).  If rebound headaches continue, they become chronic daily headaches. What are the causes? This condition may be caused by frequent use of:  Over-the-counter medicines such as aspirin, ibuprofen, and acetaminophen.  Sinus relief medicines and other medicines that contain caffeine.  Narcotic pain medicines such as codeine and oxycodone.  What are the signs or symptoms? The symptoms of a rebound headache are the same as the symptoms of the original headache. Some of the symptoms of specific types of headaches include: Migraine headache  Pulsing or throbbing pain on one or both sides of the head.  Severe pain that interferes with daily activities.  Pain that is worsened by physical  activity.  Nausea, vomiting, or both.  Pain with exposure to bright light, loud noises, or strong smells.  General sensitivity to bright light, loud noises, or strong smells.  Visual changes.  Numbness of one or both arms. Tension headache  Pressure around the head.  Dull, aching head pain.  Pain felt over the front and sides of the head.  Tenderness in the muscles of the head, neck, and shoulders. Cluster headache  Severe pain that begins in or around one eye or temple.  Redness and tearing in the eye on the same side  as the pain.  Droopy or swollen eyelid.  One-sided head pain.  Nausea.  Runny nose.  Sweaty, pale facial skin.  Restlessness. How is this diagnosed? This condition is diagnosed by:  Reviewing your medical history. This includes the nature of your primary headaches.  Reviewing the types of pain medicines that you have been using to treat your headaches and how often you take them.  How is this treated? This condition may be treated or managed by:  Discontinuing frequent use of the analgesic medicine. Doing this may worsen your headaches at first, but the pain should eventually become more manageable, less frequent, and less severe.  Seeing a headache specialist. He or she may be able to help you manage your headaches and help make sure there is not another cause of the headaches.  Using methods of stress relief, such as acupuncture, counseling, biofeedback, and massage. Talk with your health care provider about which methods might be good for you.  Follow these instructions at home:  Take over-the-counter and prescription medicines only as told by your health care provider.  Stop the repeated use of pain medicine as told by your health care provider. Stopping can be difficult. Carefully follow instructions from your health care provider.  Avoid triggers that are known to cause your primary headaches.  Keep all follow-up visits as told by your  health care provider. This is important. Contact a health care provider if:  You continue to experience headaches after following treatments that your health care provider recommended. Get help right away if:  You develop new headache pain.  You develop headache pain that is different than what you have experienced in the past.  You develop numbness or tingling in your arms or legs.  You develop changes in your speech or vision. This information is not intended to replace advice given to you by your health care provider. Make sure you discuss any questions you have with your health care provider. Document Released: 05/11/2003 Document Revised: 09/08/2015 Document Reviewed: 07/24/2015 Elsevier Interactive Patient Education  2018 [employer].  Sinus Headache A sinus headache occurs when the paranasal sinuses become clogged or swollen. Paranasal sinuses are air pockets within the bones of the face. Sinus headaches can range from mild to severe. What are the causes? A sinus headache can result from various conditions that affect the sinuses, such as:  Colds.  Sinus infections.  Allergies.  What are the signs or symptoms? The main symptom of this condition is a headache that may feel like pain or pressure in the face, forehead, ears, or upper teeth. People who have a sinus headache often have other symptoms, such as:  Congested or runny nose.  Fever.  Inability to smell.  Weather changes can make symptoms worse. How is this diagnosed? This condition may be diagnosed based on:  A physical exam and medical history.  Imaging tests, such as a CT scan and MRI, to check for problems with the sinuses.  A specialist may look into the sinuses with a tool that has a camera (endoscopy).  How is this treated? Treatment for this condition depends on the cause.  Sinus pain that is caused by a sinus infection may be treated with antibiotic medicine.  Sinus pain that is caused by  allergies may be helped by allergy medicines (antihistamines) and medicated nasal sprays.  Sinus pain that is caused by congestion may be helped by flushing the nose and sinuses with saline solution.  Follow these instructions at home:  Take medicines only as directed by your health care provider.  If you were prescribed an antibiotic medicine, finish all of it even if you start to feel better.  If you have congestion, use a nasal spray to help reduce pressure.  If directed, apply a warm, moist washcloth to your face to help relieve pain. Contact a health care provider if:  You have headaches more than one time each week.  You have sensitivity to light or sound.  You have a fever.  You feel sick to your stomach (nauseous) or you throw up (vomit).  Your headaches do not get better with treatment. Many people think that they have a sinus headache when they actually have migraines or tension headaches. Get help right away if:  You have vision problems.  You have sudden, severe pain in your face or head.  You have a seizure.  You are confused.  You have a stiff neck. This information is not intended to replace advice given to you by your health care provider. Make sure you discuss any questions you have with your health care provider. Document Released: 03/28/2004 Document Revised: 10/15/2015 Document Reviewed: 02/14/2014 Elsevier Interactive Patient Education  [employer].

## 2016-11-20 NOTE — Progress Notes (Signed)
Subjective:  By signing my name below, I, [name], attest that this documentation has been prepared under the direction and in the presence of Delman Cheadle, MD Electronically Signed: Ladene Artist, ED Scribe 11/20/2016 at 10:27 AM.   Patient ID: Omar Clark, male    DOB: 1981/01/23, 36 y.o.   MRN: [id number]  Chief Complaint  Patient presents with  . Chest Pain    x69month pt states heart feels like its stinging   . head numbness   HPI Omar STAMOURis a 36y.o. male who [facility] wax[facility] and waning chest pain x 2 months. Pt describes chest pain as squeezing and stinging in nature. He reports that he is currently experience chest pain. [place] CX[place] and EKG. Reports chronic heartburn and indigestion which he attributes to ibuprofen use for back pain. Pt takes Zantac twice daily. Pt does report 10 lb weight loss with unknown etiology and states that his BP has never been as elevated as his triage BP. Denies increased pain with deep breaths, night sweats, anxiety/stress, cough, new sob (reports chronic sob due to tobacco use), wheezing, constipation, diarrhea, difficulty urinating, h/o inhaler use. PCP is Dr. EArelia Sneddonbut states he can not be seen today.  Head Numbness  Pt reports waxing and waning left-sided head pressure and numbness since July 4. Pt states that head numbness radiates into his scalp but never below the left ear. He has noticed difficulty seeing close up, left eye pressure and chronic nasal congestion x 8 years. Denies tearing/watering of left eye, difficulty blinking, postnasal drip, facial asymmetry, rash.  Past Medical History:  Diagnosis Date  . Chronic back pain   . Depression    Current Outpatient Prescriptions on File Prior to Visit  Medication Sig Dispense Refill  . oxyCODONE (OXYCONTIN) 80 mg 12 hr tablet Take 1 tablet (80 mg total) by mouth every  12 (twelve) hours. 28 tablet 0  . oxycodone (ROXICODONE) 30 MG immediate release tablet Take 1.5 tablets (45 mg total) by mouth every 4 (four) hours as needed for pain. 270 tablet 0  . hydrOXYzine (ATARAX/VISTARIL) 25 MG tablet take 1 tablet by mouth three times a day if needed for anxiety  0  . Nicotine 21-14-7 MG/24HR KIT 1 month at 21, 1 month at 14 and 1 month at 7 (Patient not taking: Reported on 10/13/2015) 1 each 11  . oxyCODONE 30 MG 12 hr tablet Take 30 mg by mouth every 4 (four) hours as needed.    . triamcinolone cream (KENALOG) 0.1 % Apply 1 application topically 2 (two) times daily. (Patient not taking: Reported on 11/20/2016) 30 g 0   No current facility-administered medications on file prior to visit.    Allergies  Allergen Reactions  . Aspirin Hives   Review of Systems  Constitutional: Negative for diaphoresis.  HENT: Positive for congestion (chronic). Negative for postnasal drip.   Eyes: Positive for visual disturbance. Negative for discharge.  Respiratory: Negative for cough, shortness of breath and wheezing.   Cardiovascular: Positive for chest pain.  Gastrointestinal: Negative for constipation and diarrhea.  Genitourinary: Negative for difficulty urinating.  Skin: Negative for rash.  Neurological: Positive for numbness. Negative for facial asymmetry.  Psychiatric/Behavioral: The patient is not nervous/anxious.       Objective:   Physical Exam  HENT:  Right Ear: Tympanic membrane is scarred. A  middle ear effusion is present.  Left Ear: Tympanic membrane is scarred. A middle ear effusion is present.  Mouth/Throat: Posterior oropharyngeal erythema present. No oropharyngeal exudate or posterior oropharyngeal edema.  R TM is worse than L Nose: nasal congestion and rhinitis but R worse than L  Neck: No thyroid mass and no thyromegaly present.  Cardiovascular: Regular rhythm, S1 normal, S2 normal and normal heart sounds.  Tachycardia present.   No murmur  heard. Pulmonary/Chest: Effort normal and breath sounds normal. He exhibits no tenderness.  Lungs clear. Good air movement.   Abdominal: Soft. Bowel sounds are normal. He exhibits no distension. There is no hepatosplenomegaly. There is no tenderness.  Lymphadenopathy:    He has no cervical adenopathy.   BP (!) 144/90   Pulse 86   Temp 98.3 F (36.8 C) (Oral)   Resp 18   Ht '5\' 11"'  (1.803 m)   Wt 164 lb 9.6 oz (74.7 kg)   SpO2 98%   BMI 22.96 kg/m  Repeat: 140/84    Assessment & Plan:   1. Elevated blood pressure reading   2. Tobacco use disorder   3. NSAID long-term use   4. NSAID induced gastritis   5. Chronic daily headache   6. Paresthesias/numbness   7. Chronic allergic rhinitis   8. Lumbar degenerative disc disease   9. Chronic pain syndrome    Advised pt of my recommended work-up for his presenting concerns of 2 months of constant worsening chest pain and headache. Pt then informed me that he was paying out of pocket and would it be better for him to go to the ER for this eval since they take his medicaid. We contacted [facility] who informed us that they do take Medicaid and would be able to see pt today for eval of his concerns.  See AVS.  At the end of the visit, pt told my nurse Jacqualine Code who brought his AVS that he was actually here for a temporary refill of his narcotic pain medication. I was informed of this after the patient had left as I was in another pt's room. Pt is on high doses of opioids so is definitely going to require a pain management physician and I am not willing to bridge due to concerns of the appearance of doctor shopping. He has a PCP - Dr. Arelia Sneddon in [place] and I do not know why pt is unable to continue being rx'ed receiving meds by wherever he has been getting them for the past year. . .  However, I do not see that my providing a several day supply (or the requested 2 weeks) would help as often takes many mos to get into a pain clinic and I am  unwilling to provide this bridge without having any records avail from his prior rxing physician.  Orders Placed This Encounter  Procedures  . Ambulatory referral to Pain Clinic    Referral Priority:   Routine    Referral Type:   Consultation    Referral Reason:   Specialty Services Required    Requested Specialty:   Pain Medicine    Number of Visits Requested:   1    I personally performed the services described in this documentation, which was scribed in my presence. The recorded information has been reviewed and considered, and addended by me as needed.   Delman Cheadle, M.D.  Primary Care at [facility] [address] [place], [place] [address] [phone] phone [phone]  fax  11/20/16 12:02 PM

## 2016-11-20 NOTE — ED Triage Notes (Signed)
Pt  States  He   Went  To  [place]    For  Refill  Of  His  Pain  meds

## 2016-11-20 NOTE — Discharge Instructions (Signed)
Please return here or to the Emergency Department immediately should you feel worse in any way or have any of the following symptoms: increasing or different chest pain, pain that spreads to your arm, neck, jaw, back or abdomen, shortness of breath, or nausea and vomiting. Please follow up with your primary care doctor for further evaluation. Your EKG did not show signs of anything dangerous today.

## 2016-11-20 NOTE — ED Provider Notes (Signed)
  [facility]   [id number] 11/20/16 Arrival Time: 1250  ASSESSMENT & PLAN:  1. Nonspecific chest pain    Reassured that I do not think this is a heart or lung problem. I cannot explain exact etiology. Discussion over manifestations of anxiety. Reports he will continue to f/u with his PCP.  ECG: First degree AV block; sinus arrhythmia; no acute changes.  Reviewed expectations re: course of current medical issues. Questions answered. Outlined signs and symptoms indicating need for more acute intervention. Patient verbalized understanding. After Visit Summary given.   SUBJECTIVE:  Omar Clark is a 36 y.o. male who presents with complaint of intermittent non-radiating anterior chest discomfort for several months. Has been seen by PCP several times for the same. States he was sent here today to have an ECG. Describes CP as a pressure but sharp at times. Pain scale: usually 1 but a 3 at maximal intensity. No associated SOB/n/v. No injury reported. No specific aggravating or alleviating factors reported. No self treatment. Feeling discomfort now; pain scale of 1. History of GERD but this discomfort "feels different." Does admit to underlying stress. History of chronic back pain. Current smoker.  ROS: As per HPI. All other systems negative.   OBJECTIVE:  Vitals:   11/20/16 1324  BP: 128/76  Pulse: 72  Resp: 18  Temp: 98.6 F (37 C)  TempSrc: Oral  SpO2: 98%    General appearance: alert; no distress Neck: supple Chest Wall: nontender Lungs: clear to auscultation bilaterally Heart: regular rate and rhythm Abdomen: soft, non-tender; bowel sounds normal Back: no CVA tenderness Extremities: no cyanosis or edema; symmetrical with no gross deformities Skin: warm and dry Neurologic: normal gait Psychological: alert and cooperative; normal mood and affect   Allergies  Allergen Reactions  . Aspirin Hives    Past Medical History:  Diagnosis Date  . Chronic back  pain   . Depression    Social History   Social History  . Marital status: Married    Spouse name: N/A  . Number of children: 3  . Years of education: 12   Occupational History  . Not on file.   Social History Main Topics  . Smoking status: Current Every Day Smoker    Packs/day: 2.00    Years: 20.00    Types: Cigarettes  . Smokeless tobacco: Never Used  . Alcohol use No  . Drug use: No  . Sexual activity: Not on file   Other Topics Concern  . Not on file   Social History Narrative  . No narrative on file   Family History  Problem Relation Age of Onset  . Cancer Mother   . Cancer Maternal Grandmother   . Cancer Maternal Grandfather   . Cancer Paternal Grandmother   . Alzheimer's disease Paternal Grandfather    Past Surgical History:  Procedure Laterality Date  . OPEN REDUCTION INTERNAL FIXATION (ORIF) HAND       Mardella Layman, MD 11/23/16 [phone]

## 2016-11-20 NOTE — ED Triage Notes (Signed)
Pt   States   He   Was   Seen  At  [place]         And    Sent  Her  For   evaul  Of  htn    And   Has   Episodes  Of  intermittant  Chest  Pain     He   States     He  Has   Some  Pressure in  Chest     Scale  Of  1      The  Pain  Has  Been  Coming  And  Going    He  Reports  His  bp  Has  Been  Slightly  Elevated  As   Well

## 2016-11-29 ENCOUNTER — Ambulatory Visit (HOSPITAL_COMMUNITY)
Admission: EM | Admit: 2016-11-29 | Discharge: 2016-11-29 | Disposition: A | Discharge status: Home / Self Care | Payer: Medicaid Other | Attending: Internal Medicine | Admitting: Internal Medicine

## 2016-11-29 ENCOUNTER — Encounter (HOSPITAL_COMMUNITY): Payer: Self-pay | Admitting: Emergency Medicine

## 2016-11-29 DIAGNOSIS — G894 Chronic pain syndrome: Secondary | ICD-10-CM | POA: Diagnosis not present

## 2016-11-29 NOTE — ED Provider Notes (Signed)
[facility]    CSN: [id number] Arrival date & time: 11/29/16  1335     History   Chief Complaint Chief Complaint  Patient presents with  . Back Pain  . Hyperlipidemia    HPI ROLIN SCHULT is a 36 y.o. male.   36 year old male with history of chronic back pain, depression, coming in for medication refill of chronic pain medications. Patient states recently had cholesterol checked by [facility] medical, and is worried about his chest pain and relations to his cholesterol. States that he was told his LDL was 170, total cholesterol 244. He was here a week ago to evaluate for nonspecific chest pain that has been waxing and waning for the past 2 months. He also states he needs refill of his oxycodone and OxyContin for his chronic back pain. Has a PCP appointment on Monday.      Past Medical History:  Diagnosis Date  . Chronic back pain   . Depression     Patient Active Problem List   Diagnosis Date Noted  . 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 Opioid use disorder, moderate, in controlled environment ([place]) 05/24/2015  . 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 Chronic urticaria 04/14/2013  . Smoker 03/10/2013  . RAD (reactive airway disease) 03/10/2013  . Opiate addiction ([place]) 11/29/2012  . Lumbar degenerative disc disease 11/29/2012  . Chronic pain syndrome 11/29/2012    Past Surgical History:  Procedure Laterality Date  . OPEN REDUCTION INTERNAL FIXATION (ORIF) HAND         Home Medications    Prior to Admission medications   Medication Sig Start Date End Date Taking? Authorizing Provider  oxyCODONE (OXYCONTIN) 80 mg 12 hr tablet Take 1 tablet (80 mg total) by mouth every 12 (twelve) hours. 11/17/15  Yes Jeffery, Chelle, PA-C  oxycodone (ROXICODONE) 30 MG immediate release tablet Take 1.5 tablets (45 mg total) by mouth every 4 (four) hours as needed for pain. 11/10/15  Yes Ivar Drape D, PA  hydrOXYzine  (ATARAX/VISTARIL) 25 MG tablet take 1 tablet by mouth three times a day if needed for anxiety 05/29/15   [provider]  Nicotine 21-14-7 MG/24HR KIT 1 month at 21, 1 month at 14 and 1 month at 7 Patient not taking: Reported on 10/13/2015 09/07/15   Leandrew Koyanagi, MD  oxyCODONE 30 MG 12 hr tablet Take 30 mg by mouth every 4 (four) hours as needed.    [provider]  triamcinolone cream (KENALOG) 0.1 % Apply 1 application topically 2 (two) times daily. Patient not taking: Reported on 11/20/2016 10/13/15   Joretta Bachelor, PA    Family History Family History  Problem Relation Age of Onset  . Cancer Mother   . Cancer Maternal Grandmother   . Cancer Maternal Grandfather   . Cancer Paternal Grandmother   . Alzheimer's disease Paternal Grandfather     Social History Social History  Substance Use Topics  . Smoking status: Current Every Day Smoker    Packs/day: 2.00    Years: 20.00    Types: Cigarettes  . Smokeless tobacco: Never Used  . Alcohol use No     Allergies   Aspirin   Review of Systems Review of Systems  Reason unable to perform ROS: See HPI as above.     Physical Exam Triage Vital Signs ED Triage Vitals [11/29/16 1437]  Enc Vitals Group     BP 123/82     Pulse Rate 76  Resp 16     Temp 98.2 F (36.8 C)     Temp Source Oral     SpO2 100 %     Weight      Height      Head Circumference      Peak Flow      Pain Score 8     Pain Loc      Pain Edu?      Excl. in [place]?    No data found.   Updated Vital Signs BP 123/82 (BP Location: Right Arm)   Pulse 76   Temp 98.2 F (36.8 C) (Oral)   Resp 16   SpO2 100%    Physical Exam  Constitutional: He is oriented to person, place, and time. He appears well-developed and well-nourished. No distress.  HENT:  Head: Normocephalic and atraumatic.  Eyes: Pupils are equal, round, and reactive to light. Conjunctivae are normal.  Neck: Normal range of motion. Neck supple.    Pulmonary/Chest: Effort normal. No respiratory distress.  Neurological: He is alert and oriented to person, place, and time.     UC Treatments / Results  Labs (all labs ordered are listed, but only abnormal results are displayed) Labs Reviewed - No data to display  EKG  EKG Interpretation None       Radiology No results found.  Procedures Procedures (including critical care time)  Medications Ordered in UC Medications - No data to display   Initial Impression / Assessment and Plan / UC Course  I have reviewed the triage vital signs and the nursing notes.  Pertinent labs & imaging results that were available during my care of the patient were reviewed by me and considered in my medical decision making (see chart for details).    Patient left prior to full HPI/exam after being informed I am unable to refill his chronic pain medication. Patient was without obvious distress on appearance, and was able to ambulate without problem.   Final Clinical Impressions(s) / UC Diagnoses   Final diagnoses:  Chronic pain syndrome    New Prescriptions New Prescriptions   No medications on file     Arturo Morton 11/29/16 1513

## 2016-11-29 NOTE — ED Triage Notes (Signed)
Pt here for chronic back pain and hyperlipidemia  Sts he has appt w/PCP on Monday and needing pain meds until then  A&O x4... NAD... Ambulatory

## 2016-12-17 ENCOUNTER — Encounter (HOSPITAL_COMMUNITY): Payer: Self-pay | Admitting: Emergency Medicine

## 2016-12-17 ENCOUNTER — Ambulatory Visit (HOSPITAL_COMMUNITY)
Admission: EM | Admit: 2016-12-17 | Discharge: 2016-12-17 | Disposition: A | Discharge status: Home / Self Care | Payer: Medicaid Other | Attending: Family Medicine | Admitting: Family Medicine

## 2016-12-17 DIAGNOSIS — S161XXA Strain of muscle, fascia and tendon at neck level, initial encounter: Secondary | ICD-10-CM

## 2016-12-17 MED ORDER — PREDNISONE 10 MG (21) PO TBPK: ORAL_TABLET | Freq: Every day | ORAL | 0 refills | Status: AC

## 2016-12-17 NOTE — ED Provider Notes (Signed)
  [facility]   [id number] 12/17/16 Arrival Time: 1018  ASSESSMENT & PLAN:  1. Acute strain of neck muscle, initial encounter    Meds ordered this encounter  Medications  . predniSONE (STERAPRED UNI-PAK 21 TAB) 10 MG (21) TBPK tablet    Sig: Take by mouth daily. Take as directed.    Dispense:  21 tablet    Refill:  0   Trial of prednisone. Is followed for chronic LBP by pain management. May use OTC as needed. Ensure ROM. Reviewed expectations re: course of current medical issues. Questions answered. Outlined signs and symptoms indicating need for more acute intervention. Patient verbalized understanding. After Visit Summary given.  SUBJECTIVE:  Omar Clark is a 36 y.o. male who presents with complaint of upper back and neck discomfort. Gradual onset over the past 2-3 weeks. No injury/trauma reported. No extremity sensation changes or weakness. Discomfort exacerbated by certain movements. No specific alleviating factors reported.  OTC treatment: No OTC analgesics taken.  ROS: As per HPI.   OBJECTIVE:  Vitals:   12/17/16 1132 12/17/16 1135  BP:  128/87  Pulse:  68  Resp:  16  Temp:  98.5 F (36.9 C)  TempSrc:  Oral  SpO2:  99%  Weight: 175 lb (79.4 kg)   Height:  (1.803 m)     General appearance: alert; no distress HEENT: normocephalic; atraumatic; conjunctivae normal; TMs normal; oral mucosa normal Neck: supple with FROM but moves slowly; no midline tenderness; does have tenderness of cervical musculature extending over trapezius distribution bilaterally Lungs: clear to auscultation bilaterally Back: no midline tenderness of upper back Extremities: moves all extremities normally; no cyanosis or edema; symmetrical with no gross deformities Skin: warm and dry Neurologic: normal gait Psychological: alert and cooperative; normal mood and affect   Allergies  Allergen Reactions  . Aspirin Hives   Past Medical History:  Diagnosis Date  .  Chronic back pain   . Depression    Past Surgical History:  Procedure Laterality Date  . OPEN REDUCTION INTERNAL FIXATION (ORIF) HAND     Family History  Problem Relation Age of Onset  . Cancer Mother   . Cancer Maternal Grandmother   . Cancer Maternal Grandfather   . Cancer Paternal Grandmother   . Alzheimer's disease Paternal Glynda Jaeger, MD 12/17/16 1620

## 2016-12-17 NOTE — ED Triage Notes (Signed)
PT reports long term lumbar back pain. PT reports for the last month he has had significant pain between shoulders and up into neck. No known injury or strain.

## 2016-12-20 ENCOUNTER — Other Ambulatory Visit: Payer: Self-pay | Admitting: Family Medicine

## 2016-12-20 DIAGNOSIS — M542 Cervicalgia: Secondary | ICD-10-CM

## 2016-12-28 ENCOUNTER — Other Ambulatory Visit: Payer: Self-pay

## 2017-03-19 IMAGING — CR DG CHEST 2V
2 series · 2 of 2 positions shown · non-contrast
Comparison: 04/03/2012

CLINICAL DATA: Chest pain.  Fall 4 days prior injuring left ribs.

EXAM:
CHEST  2 VIEW

[w chest pa]
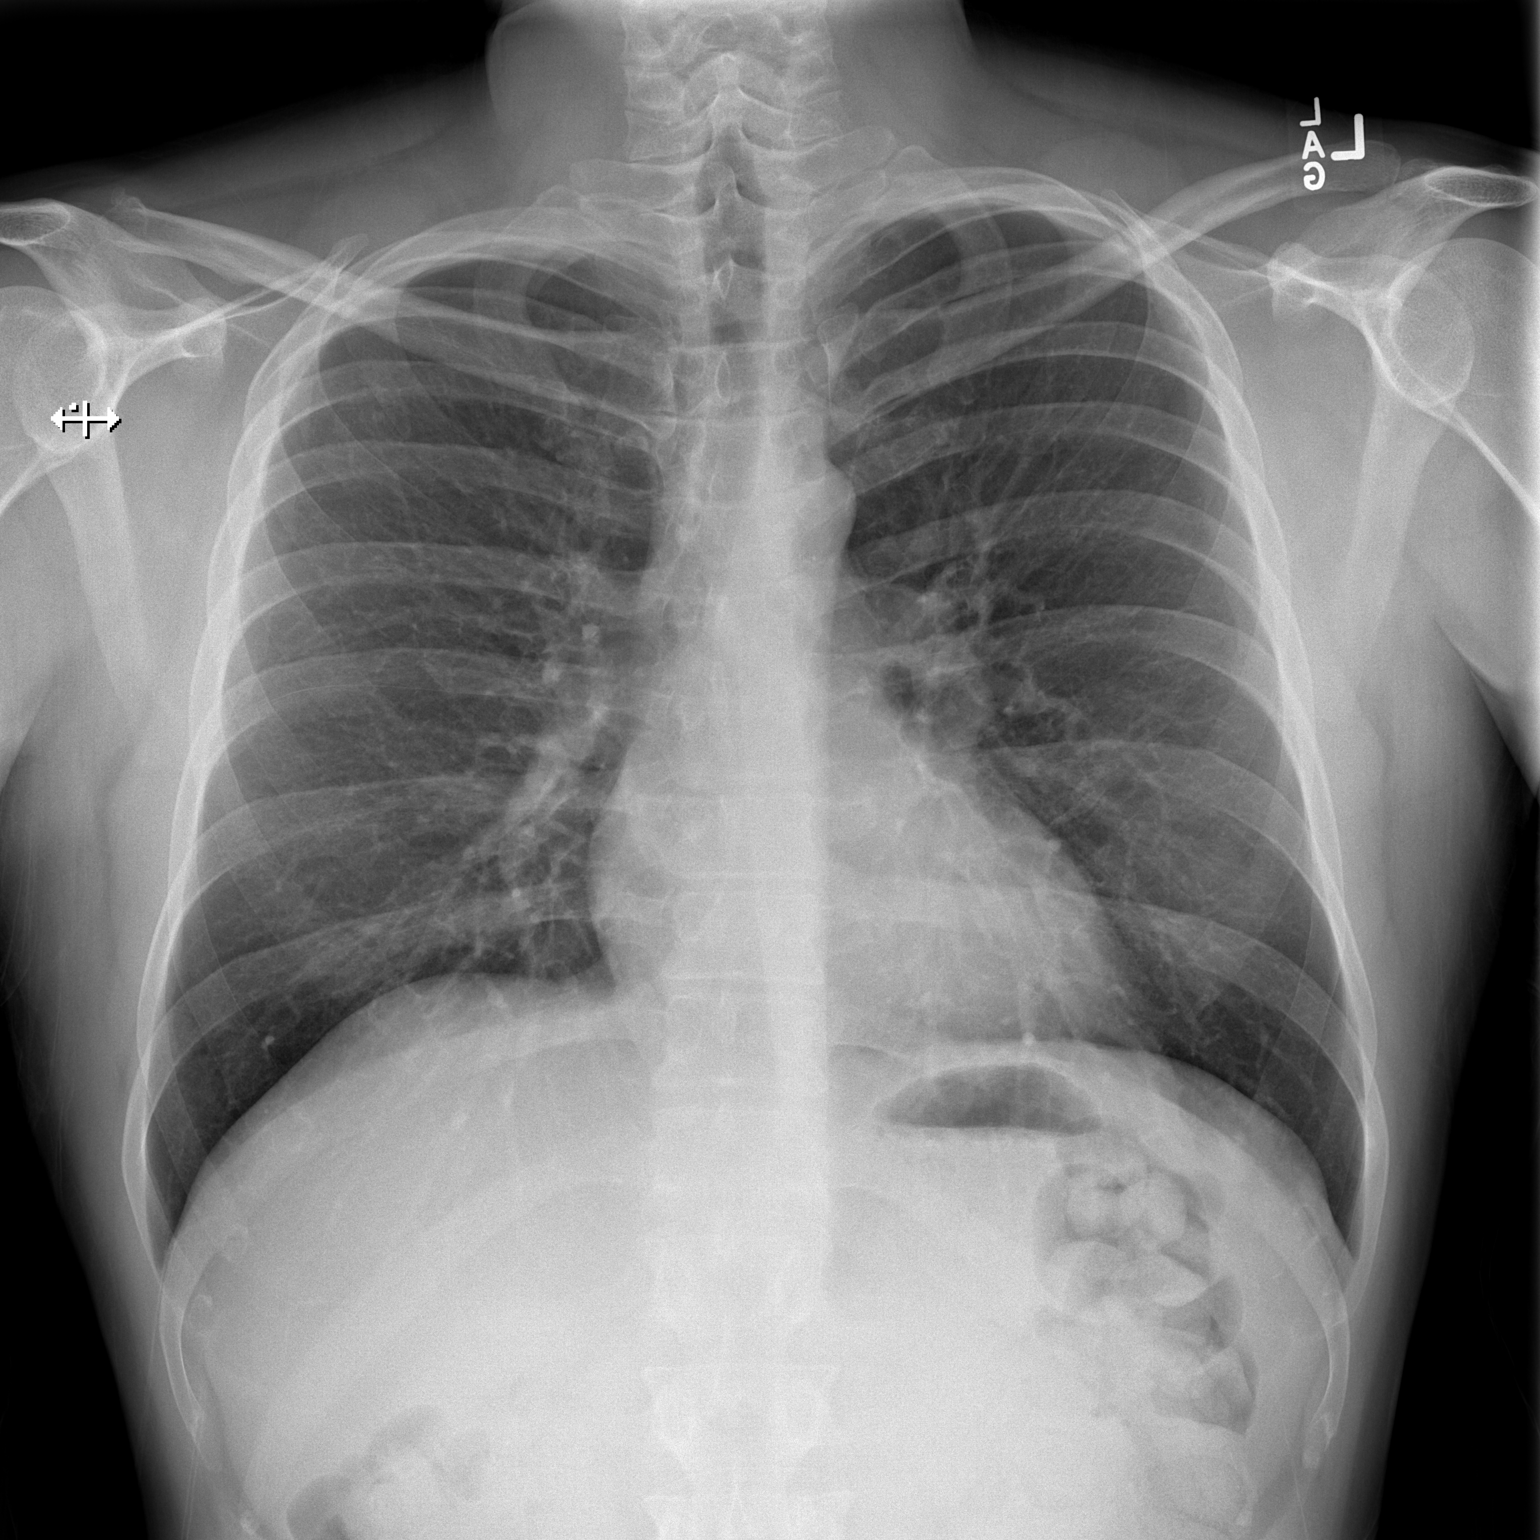

[w chest lat]
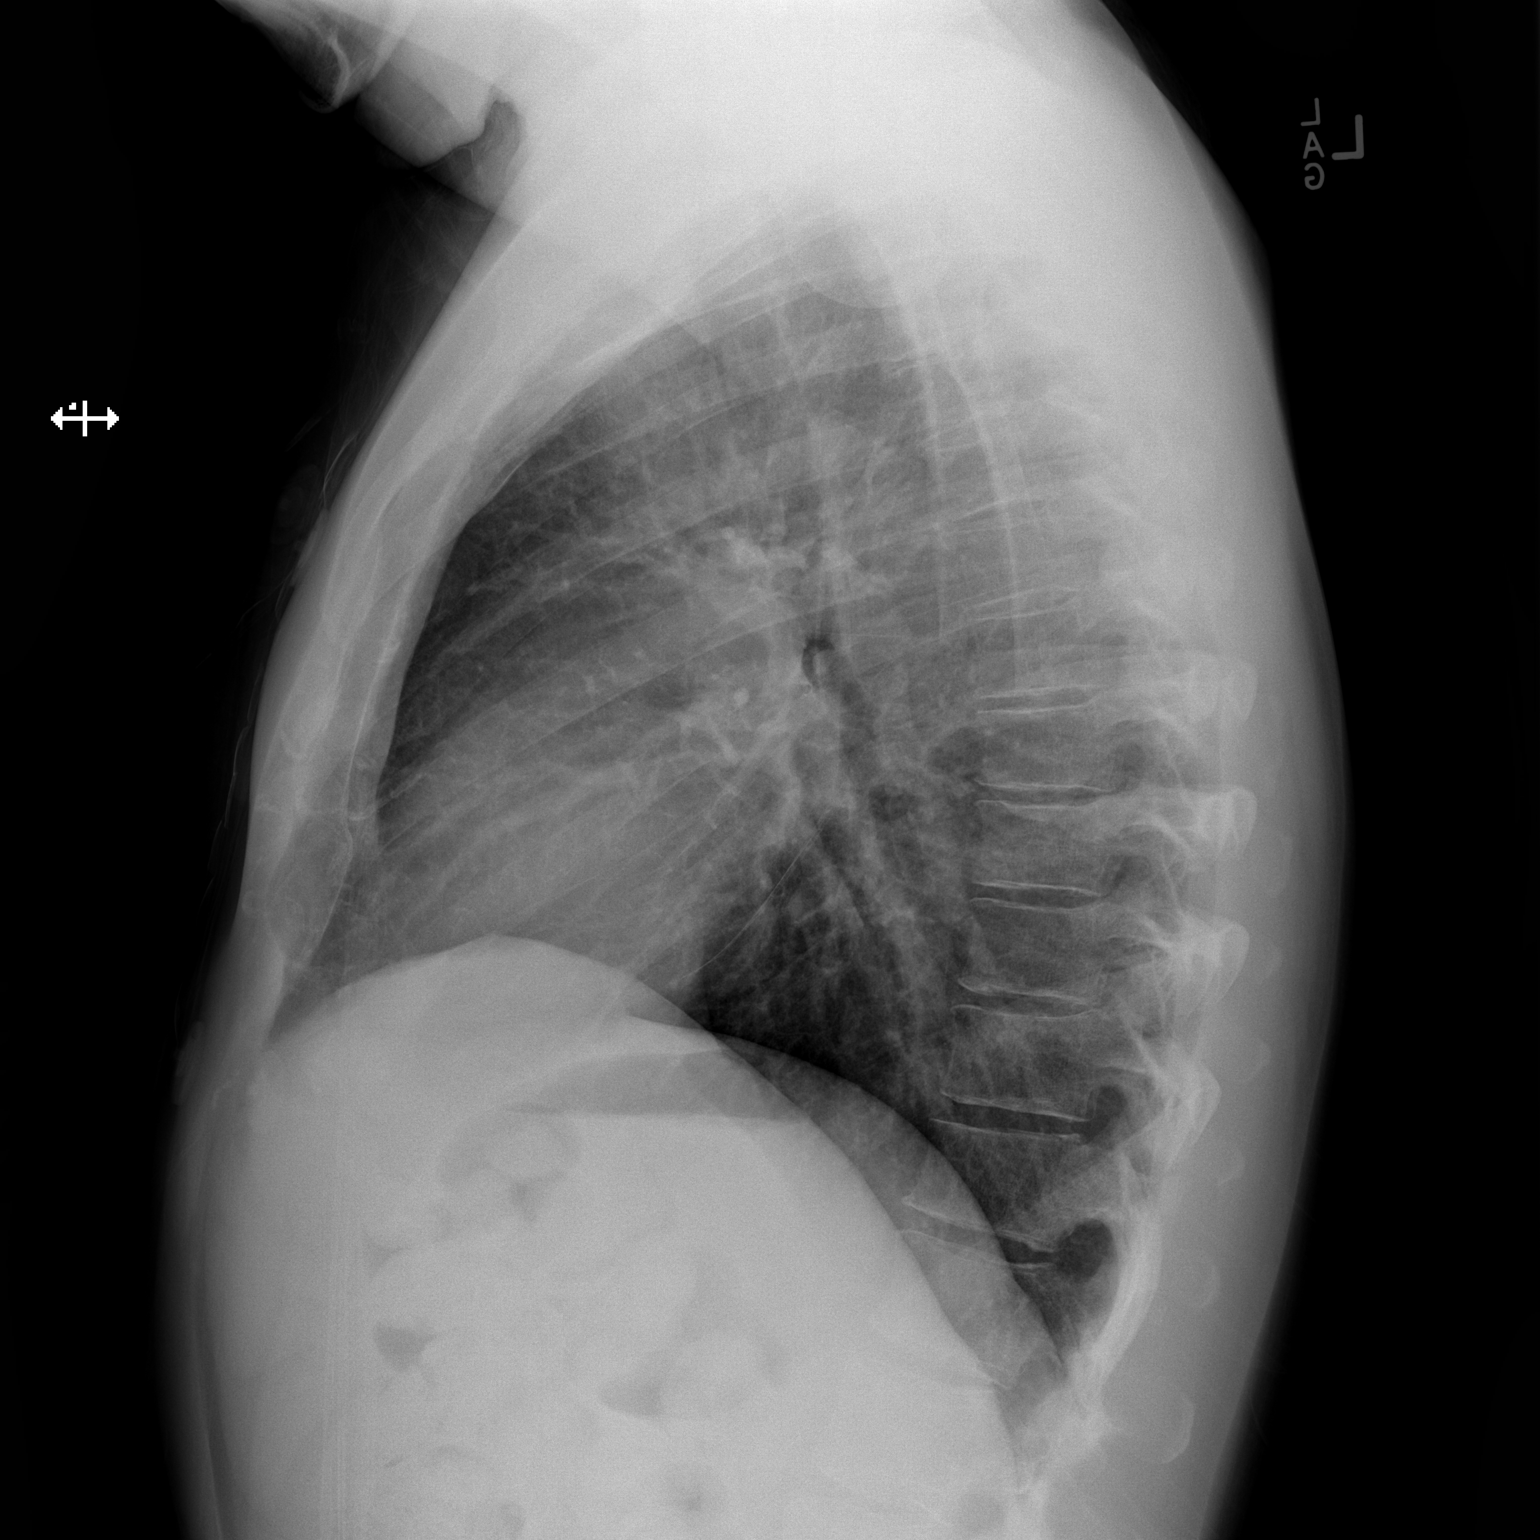

[2 of 2 positions shown; findings below may reference images not displayed]

FINDINGS: The cardiomediastinal contours are normal. The lungs are clear.
Pulmonary vasculature is normal. No consolidation, pleural effusion,
or pneumothorax. No acute osseous abnormalities are seen. No
displaced rib fracture.
IMPRESSION: No acute process.

## 2017-07-24 ENCOUNTER — Encounter: Payer: Self-pay | Admitting: Family Medicine
